# Patient Record
Sex: Male | Born: 1945 | Race: Black or African American | Hispanic: No | Smoking: Former smoker
Health system: Southern US, Community
[De-identification: ages and names within clinical notes are randomized; demographics above are authoritative.]

## PROBLEM LIST (undated history)

## (undated) DIAGNOSIS — R531 Weakness: Secondary | ICD-10-CM

## (undated) DIAGNOSIS — Z973 Presence of spectacles and contact lenses: Secondary | ICD-10-CM

## (undated) DIAGNOSIS — R51 Headache: Secondary | ICD-10-CM

## (undated) DIAGNOSIS — M329 Systemic lupus erythematosus, unspecified: Secondary | ICD-10-CM

## (undated) DIAGNOSIS — M3500 Sicca syndrome, unspecified: Secondary | ICD-10-CM

## (undated) DIAGNOSIS — Z993 Dependence on wheelchair: Secondary | ICD-10-CM

## (undated) DIAGNOSIS — K219 Gastro-esophageal reflux disease without esophagitis: Secondary | ICD-10-CM

## (undated) DIAGNOSIS — N320 Bladder-neck obstruction: Secondary | ICD-10-CM

## (undated) DIAGNOSIS — K08109 Complete loss of teeth, unspecified cause, unspecified class: Secondary | ICD-10-CM

## (undated) DIAGNOSIS — I10 Essential (primary) hypertension: Secondary | ICD-10-CM

## (undated) DIAGNOSIS — Z8719 Personal history of other diseases of the digestive system: Secondary | ICD-10-CM

## (undated) DIAGNOSIS — C801 Malignant (primary) neoplasm, unspecified: Secondary | ICD-10-CM

## (undated) DIAGNOSIS — N400 Enlarged prostate without lower urinary tract symptoms: Secondary | ICD-10-CM

## (undated) DIAGNOSIS — I252 Old myocardial infarction: Secondary | ICD-10-CM

## (undated) DIAGNOSIS — M1990 Unspecified osteoarthritis, unspecified site: Secondary | ICD-10-CM

## (undated) DIAGNOSIS — Z992 Dependence on renal dialysis: Secondary | ICD-10-CM

## (undated) DIAGNOSIS — Z972 Presence of dental prosthetic device (complete) (partial): Secondary | ICD-10-CM

## (undated) DIAGNOSIS — N189 Chronic kidney disease, unspecified: Secondary | ICD-10-CM

## (undated) HISTORY — DX: Old myocardial infarction: I25.2

## (undated) HISTORY — DX: Sjogren syndrome, unspecified: M32.9

## (undated) HISTORY — DX: Sicca syndrome, unspecified: M35.00

## (undated) HISTORY — PX: RENAL BIOPSY: SHX156

## (undated) HISTORY — DX: Essential (primary) hypertension: I10

## (undated) HISTORY — PX: AV FISTULA REPAIR: SHX563

## (undated) HISTORY — DX: Benign prostatic hyperplasia without lower urinary tract symptoms: N40.0

## (undated) HISTORY — PX: AV FISTULA PLACEMENT: SHX1204

## (undated) HISTORY — PX: INSERTION OF DIALYSIS CATHETER: SHX1324

## (undated) HISTORY — PX: REMOVAL OF A DIALYSIS CATHETER: SHX6053

## (undated) HISTORY — DX: Bladder-neck obstruction: N32.0

## (undated) HISTORY — PX: COLONOSCOPY: SHX174

## (undated) HISTORY — DX: Unspecified osteoarthritis, unspecified site: M19.90

## (undated) HISTORY — DX: Personal history of other diseases of the digestive system: Z87.19

## (undated) HISTORY — DX: Gastro-esophageal reflux disease without esophagitis: K21.9

---

## 2003-11-27 DIAGNOSIS — I252 Old myocardial infarction: Secondary | ICD-10-CM

## 2003-11-27 HISTORY — DX: Old myocardial infarction: I25.2

## 2004-09-06 ENCOUNTER — Emergency Department: Payer: Self-pay | Admitting: Emergency Medicine

## 2004-09-11 ENCOUNTER — Emergency Department: Payer: Self-pay | Admitting: Emergency Medicine

## 2004-09-11 ENCOUNTER — Other Ambulatory Visit: Payer: Self-pay

## 2005-01-24 ENCOUNTER — Ambulatory Visit: Payer: Self-pay | Admitting: Internal Medicine

## 2005-01-25 ENCOUNTER — Ambulatory Visit: Payer: Self-pay | Admitting: Internal Medicine

## 2006-11-26 DIAGNOSIS — C801 Malignant (primary) neoplasm, unspecified: Secondary | ICD-10-CM

## 2006-11-26 HISTORY — DX: Malignant (primary) neoplasm, unspecified: C80.1

## 2007-03-03 ENCOUNTER — Ambulatory Visit: Payer: Self-pay | Admitting: Gastroenterology

## 2007-08-27 HISTORY — PX: CARDIAC CATHETERIZATION: SHX172

## 2008-01-25 ENCOUNTER — Ambulatory Visit: Payer: PRIVATE HEALTH INSURANCE | Admitting: Radiation Oncology

## 2008-01-26 ENCOUNTER — Other Ambulatory Visit: Payer: Self-pay

## 2008-01-26 ENCOUNTER — Emergency Department: Payer: PRIVATE HEALTH INSURANCE | Admitting: Emergency Medicine

## 2008-02-20 ENCOUNTER — Ambulatory Visit: Payer: PRIVATE HEALTH INSURANCE | Admitting: Radiation Oncology

## 2008-02-25 ENCOUNTER — Ambulatory Visit: Payer: PRIVATE HEALTH INSURANCE | Admitting: Radiation Oncology

## 2008-03-26 ENCOUNTER — Ambulatory Visit: Payer: PRIVATE HEALTH INSURANCE | Admitting: Radiation Oncology

## 2008-04-26 ENCOUNTER — Ambulatory Visit: Payer: PRIVATE HEALTH INSURANCE | Admitting: Radiation Oncology

## 2008-05-26 ENCOUNTER — Ambulatory Visit: Payer: PRIVATE HEALTH INSURANCE | Admitting: Radiation Oncology

## 2008-06-26 ENCOUNTER — Ambulatory Visit: Payer: PRIVATE HEALTH INSURANCE | Admitting: Radiation Oncology

## 2008-08-17 ENCOUNTER — Ambulatory Visit: Payer: PRIVATE HEALTH INSURANCE | Admitting: Nephrology

## 2008-09-26 ENCOUNTER — Ambulatory Visit: Payer: PRIVATE HEALTH INSURANCE | Admitting: Radiation Oncology

## 2008-10-04 ENCOUNTER — Ambulatory Visit: Payer: PRIVATE HEALTH INSURANCE | Admitting: Radiation Oncology

## 2008-10-26 ENCOUNTER — Ambulatory Visit: Payer: PRIVATE HEALTH INSURANCE | Admitting: Radiation Oncology

## 2009-03-26 ENCOUNTER — Ambulatory Visit: Payer: Self-pay | Admitting: Radiation Oncology

## 2009-04-04 ENCOUNTER — Ambulatory Visit: Payer: Self-pay | Admitting: Radiation Oncology

## 2009-04-26 ENCOUNTER — Ambulatory Visit: Payer: Self-pay | Admitting: Radiation Oncology

## 2009-11-26 HISTORY — PX: DIALYSIS FISTULA CREATION: SHX611

## 2011-07-17 ENCOUNTER — Emergency Department: Payer: Self-pay | Admitting: Emergency Medicine

## 2011-08-02 ENCOUNTER — Emergency Department: Payer: Self-pay | Admitting: Emergency Medicine

## 2011-08-03 LAB — PSA: PSA: 0.6 ng/mL (ref 0.0–4.0)

## 2011-08-04 ENCOUNTER — Emergency Department: Payer: Self-pay | Admitting: *Deleted

## 2011-09-25 ENCOUNTER — Ambulatory Visit: Payer: Self-pay | Admitting: Internal Medicine

## 2011-09-29 ENCOUNTER — Emergency Department: Payer: Self-pay | Admitting: Emergency Medicine

## 2013-05-05 ENCOUNTER — Encounter: Payer: Self-pay | Admitting: *Deleted

## 2013-05-06 ENCOUNTER — Encounter: Payer: Self-pay | Admitting: Cardiology

## 2013-05-06 ENCOUNTER — Ambulatory Visit (INDEPENDENT_AMBULATORY_CARE_PROVIDER_SITE_OTHER): Payer: Medicare Other | Admitting: Cardiology

## 2013-05-06 VITALS — BP 156/70 | HR 74 | Ht 68.0 in | Wt 142.0 lb

## 2013-05-06 DIAGNOSIS — I10 Essential (primary) hypertension: Secondary | ICD-10-CM | POA: Insufficient documentation

## 2013-05-06 DIAGNOSIS — E7800 Pure hypercholesterolemia, unspecified: Secondary | ICD-10-CM

## 2013-05-06 DIAGNOSIS — I2510 Atherosclerotic heart disease of native coronary artery without angina pectoris: Secondary | ICD-10-CM

## 2013-05-06 DIAGNOSIS — R0602 Shortness of breath: Secondary | ICD-10-CM

## 2013-05-06 DIAGNOSIS — E785 Hyperlipidemia, unspecified: Secondary | ICD-10-CM | POA: Insufficient documentation

## 2013-05-06 MED ORDER — ATORVASTATIN CALCIUM 20 MG PO TABS: 20.0000 mg | ORAL_TABLET | Freq: Every day | ORAL | Status: DC

## 2013-05-06 NOTE — Progress Notes (Signed)
Patient ID: Christopher [name], male   DOB: 07-10-1946, 67 y.o.   MRN: [id number] PCP: Dr. Austin Miles [facility])  67 yo with history of ESRD and prior NSTEMIs with multiple caths at [facility] presents for cardiology evaluation prior to knee surgery.  On reviewing his history, it appears that he had 4 cardiac caths between 12/05 and 10/08.  The NSTEMIs all seemed to be type II NSTEMIs in the setting of volume overload as cardiac caths showed only nonobstructive disease.  He is not taking aspirin because of hematuria thought to be related to the radiation he had for prostate cancer.    Patient has bilateral severe knee pain from OA.  He needs operations on both knees.  The left knee will be operated on first.  He is not very active at all due to knee pain.  He walks in his house with a walker.  He could not get up a flight of steps.  No chest pain.  He has mild exertional dyspnea which he attributes to having to work harder to walk due to his knee pain.  Blood pressure is high today.  He says that his BP varies considerably and tends to look ok when he is at dialysis.  ECG: NSR, LVH with repolarization abnormality  PMH: 1. CAD: NSTEMI 12/05 with LHC at [facility] showing 30% mLAD.  NSTEMI in 1/07 with LHC at [facility] in 1/07 with nonobstructive disease.  Echo (1/07) with EF 55%, LVH, aortic sclerosis.  NSTEMI in 5/07 with LHC at [facility] in 5/07 showing nonobstructive disease.  Myoview 8/08 at [facility] showing inferior attenuation.  LHC at [facility] in 10/08 showing nonobstructive disease.  Echo (3/12) with EF > 55%.  NSTEMIs thought to be type II in setting of volume overload.  2. 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  On transplant list.  3. SLE 4. Sjogrens syndrome 5. Prostate cancer s/p radiation treatment in 2009.  6. BPH 7. HTN 8. GERD 9. OA 10. Bladder outlet obstruction with multiple stones 11. H/o pancreatitis. 12. Hematuria thought to be due to scarring after radiation for prostate cancer.     SH: Quit smoking in 1980s.  Lives in [place].   FH: Mother with MI at 56, brother with MI at 22.   ROS: All systems reviewed and negative except as per HPI.   Current Outpatient Prescriptions  Medication Sig Dispense Refill  . amLODipine (NORVASC) 10 MG tablet Take 10 mg by mouth daily.       . enalapril (VASOTEC) 20 MG tablet Take 20 mg by mouth 2 (two) times daily.       Marland Kitchen esomeprazole (NEXIUM) 20 MG capsule Take 20 mg by mouth daily before breakfast.      . metoprolol (LOPRESSOR) 100 MG tablet Take 100 mg by mouth 2 (two) times daily.      Marland Kitchen atorvastatin (LIPITOR) 20 MG tablet Take 1 tablet (20 mg total) by mouth daily.  30 tablet  3   No current facility-administered medications for this visit.    BP 156/70  Pulse 74  Ht 5\' 8"  (1.727 m)  Wt 142 lb (64.411 kg)  BMI 21.6 kg/m2 General: NAD Neck: No JVD, no thyromegaly or thyroid nodule.  Lungs: Clear to auscultation bilaterally with normal respiratory effort. CV: Nondisplaced PMI.  Heart regular S1/S2, +S4, no murmur.  No peripheral edema.  No carotid bruit.  Normal pedal pulses.  Abdomen: Soft, nontender, no hepatosplenomegaly, no distention.  Skin: Intact without lesions or rashes.  Neurologic: Alert and oriented x 3.  Psych: Normal affect. Extremities: No clubbing or cyanosis.  HEENT: Normal.   Assessment/Plan: 1. CAD: Nonobstructive CAD on prior cardiac caths (x 4 total, last in 10/08, all at [facility]).  He had an NSTEMI prior to each cath.  I suspect these were type II events in the setting volume overload.   - He has been told not to take ASA because of bleeding risk (hematuria).  - He should restart his statin.  - He is minimally active and needs to undergo knee surgery with general anesthesia.  He is unable to climb up a flight of steps.  I do think a Lexiscan Sestamibi would be reasonable for pre-op risk stratification.  2. Hyperlipidemia: Given nonobstructive CAD, I think it would be a good idea to start statin.   He was on atorvastatin in the past (several years) and stopped it due to expense.  Since then, it has gone generic.  I will restart him on atorvastatin 20 mg daily with lipids/LFTs in 2 months.   3. Hypertension: BP high today.  Has tended to run in normal range, however, per patient.  I will let his dialysis MD adjust his BP meds.   If stress test is low risk, he can undergo surgery.  He will need followup in 6 months in that case at our [place] office.   Marca Ancona 05/06/2013

## 2013-05-06 NOTE — Patient Instructions (Addendum)
Your physician has requested that you have a lexiscan myoview. For further information please visit [email or web address] Please follow instruction sheet, as given.  Start atorvastatin 20mg  daily.   Your physician recommends that you return for a FASTING lipid profile /liver profile in 2 months. This can be done in the [facility].   Your physician wants you to follow-up in: 6 months with Dr Mariah Milling or Kirke Corin in the [facility]. (December 2014).You will receive a reminder letter in the mail two months in advance. If you don't receive a letter, please call our office to schedule the follow-up appointment.

## 2013-05-07 NOTE — Addendum Note (Signed)
Addended by: Jacqlyn Krauss on: 05/07/2013 07:46 AM   Modules accepted: Orders

## 2013-05-13 ENCOUNTER — Encounter (HOSPITAL_COMMUNITY): Admission: RE | Admit: 2013-05-13 | Payer: Medicare Other | Source: Ambulatory Visit

## 2013-05-13 ENCOUNTER — Encounter (HOSPITAL_COMMUNITY)
Admission: RE | Admit: 2013-05-13 | Discharge: 2013-05-13 | Disposition: A | Discharge status: Home / Self Care | Payer: Medicare Other | Source: Ambulatory Visit | Attending: Cardiology | Admitting: Cardiology

## 2013-05-13 DIAGNOSIS — E7800 Pure hypercholesterolemia, unspecified: Secondary | ICD-10-CM

## 2013-05-13 DIAGNOSIS — R0602 Shortness of breath: Secondary | ICD-10-CM

## 2013-05-18 ENCOUNTER — Encounter (HOSPITAL_COMMUNITY): Payer: PRIVATE HEALTH INSURANCE

## 2013-05-25 ENCOUNTER — Telehealth: Payer: Self-pay | Admitting: Cardiology

## 2013-05-25 NOTE — Telephone Encounter (Signed)
Al Pimple spoke with Carollee Herter at [facility] (not [facility]) nuclear  medicine. Carollee Herter states imaging was obtained but not good quality.  Carollee Herter  states someone (she does not know a name)  from [employer] was called and the test was cancelled due to poor imaging. This is all the information Jasmine December was able to get. I will forward to Dr Shirlee Latch for review.

## 2013-05-25 NOTE — Telephone Encounter (Signed)
Spoke with patient.

## 2013-05-25 NOTE — Telephone Encounter (Signed)
Should have been Lexiscan, so not sure why he could not complete the test.  Also, why in [place]? See if you can find out what happened in [place].  He can be rescheduled for [facility] in our office, which is where it should have been done anyway.

## 2013-05-25 NOTE — Telephone Encounter (Signed)
Is there a reason we cannot do a Lexiscan Sesamibi in the office? If not, just do it in the office, images are usually better.  Otherwise, can have a dobutamine stress echo instead of a nuclear study.

## 2013-05-25 NOTE — Telephone Encounter (Signed)
New Prob    Pt would like to speak to nurse regarding his last myocardial perfusion. Please call.

## 2013-05-25 NOTE — Telephone Encounter (Signed)
Pt was scheduled at [facility] because of his physical limitations. I spoke with patient and he states he did not require any special equipment to be positioned for testing at [facility]. I am going to forward to  Nuc Med to contact patient to see if they feel myoview could be done here.

## 2013-05-25 NOTE — Telephone Encounter (Signed)
Pt states he went [place] to have nuclear stress test done. Pt states he was unable to complete the test. He was told they were unable to get images and test was never completed. He states he was told Dr Shirlee Latch was notified and there were other testing options. I will forward to Dr Shirlee Latch for review.

## 2013-06-01 ENCOUNTER — Ambulatory Visit (HOSPITAL_COMMUNITY): Payer: Medicare Other | Attending: Cardiology | Admitting: Radiology

## 2013-06-01 VITALS — BP 156/87 | Ht 68.0 in | Wt 141.0 lb

## 2013-06-01 DIAGNOSIS — R9431 Abnormal electrocardiogram [ECG] [EKG]: Secondary | ICD-10-CM

## 2013-06-01 DIAGNOSIS — R0602 Shortness of breath: Secondary | ICD-10-CM

## 2013-06-01 DIAGNOSIS — Z87891 Personal history of nicotine dependence: Secondary | ICD-10-CM | POA: Insufficient documentation

## 2013-06-01 DIAGNOSIS — R0609 Other forms of dyspnea: Secondary | ICD-10-CM | POA: Insufficient documentation

## 2013-06-01 DIAGNOSIS — I10 Essential (primary) hypertension: Secondary | ICD-10-CM | POA: Insufficient documentation

## 2013-06-01 DIAGNOSIS — Z8249 Family history of ischemic heart disease and other diseases of the circulatory system: Secondary | ICD-10-CM | POA: Insufficient documentation

## 2013-06-01 DIAGNOSIS — I252 Old myocardial infarction: Secondary | ICD-10-CM | POA: Insufficient documentation

## 2013-06-01 DIAGNOSIS — Z01810 Encounter for preprocedural cardiovascular examination: Secondary | ICD-10-CM | POA: Insufficient documentation

## 2013-06-01 DIAGNOSIS — R0989 Other specified symptoms and signs involving the circulatory and respiratory systems: Secondary | ICD-10-CM | POA: Insufficient documentation

## 2013-06-01 MED ORDER — REGADENOSON 0.4 MG/5ML IV SOLN
0.4000 mg | Freq: Once | INTRAVENOUS | Status: AC
  Administered 2013-06-01: 0.4 mg via INTRAVENOUS

## 2013-06-01 MED ORDER — TECHNETIUM TC 99M SESTAMIBI GENERIC - CARDIOLITE
10.0000 | Freq: Once | INTRAVENOUS | Status: AC | PRN
  Administered 2013-06-01: 10 via INTRAVENOUS

## 2013-06-01 MED ORDER — TECHNETIUM TC 99M SESTAMIBI GENERIC - CARDIOLITE
30.0000 | Freq: Once | INTRAVENOUS | Status: AC | PRN
  Administered 2013-06-01: 30 via INTRAVENOUS

## 2013-06-01 NOTE — Progress Notes (Addendum)
[facility] SITE 3 NUCLEAR MED [address] [place], [place] [address] [phone]    Cardiology Nuclear Med Study  Christopher [name] is a 67 y.o. male     MRN : [id number]     DOB: 09-Oct-1946  Procedure Date: 06/01/2013  Nuclear Med Background Indication for Stress Test:  Evaluation for Ischemia, and Surgical Clearance for  (L) Knee Surgery by Christopher Romans, MD History:  '08 MI-Several 2nd to volume overload-Heart Cath: 4 Cath @ [facility] N/O CAD EF: 78% MPS: @ [facility] (-) ischemia inferior attenuation 3/12 ECHO: EF: 55% Cardiac Risk Factors: Family History - CAD, History of Smoking and Hypertension  Symptoms:  DOE   Nuclear Pre-Procedure Caffeine/Decaff Intake:  None> 12 hrs NPO After: 9:30pm   Lungs:  clear O2 Sat: 100% on room air. IV 0.9% NS with Angio Cath:  20g  IV Site: L Antecubital x 1, tolerated well IV Started by:  Christopher Hong, RN  Chest Size (in):  40 Cup Size: n/a  Height: 5\' 8"  (1.727 m)  Weight:  141 lb (63.957 kg)  BMI:  Body mass index is 21.44 kg/(m^2). Tech Comments:  Nexium this am; Took Lopressor last night.    Nuclear Med Study 1 or 2 day study: 1 day  Stress Test Type:  Christopher [name]  Reading MD: Christopher Clement, MD  Order Authorizing Provider:  Marca Ancona, MD  Resting Radionuclide: Technetium 100m Sestamibi  Resting Radionuclide Dose: 11.0 mCi   Stress Radionuclide:  Technetium 42m Sestamibi  Stress Radionuclide Dose: 33.0 mCi           Stress Protocol Rest HR: 59 Stress HR: 88  Rest BP: 156/87 Stress BP: 154/86  Exercise Time (min): n/a METS: n/a   Predicted Max HR: 151 bpm % Max HR: 58.28 bpm Rate Pressure Product: 21308   Dose of Adenosine (mg):  n/a Dose of Lexiscan: 0.4 mg  Dose of Atropine (mg): n/a Dose of Dobutamine: n/a mcg/kg/min (at max HR)  Stress Test Technologist: Christopher [name], EMT-P  Nuclear Technologist:  Christopher [name], CNMT     Rest Procedure:  Myocardial perfusion imaging was performed at rest 45 minutes following the  intravenous administration of Technetium 72m Sestamibi. Rest ECG: NSR with non-specific ST-T wave changes  Stress Procedure:  The patient received IV Lexiscan 0.4 mg over 15-seconds.  Technetium 74m Sestamibi injected at 30-seconds. This patient had abdominal pain and was fatigued with the Lexiscan injection. Quantitative spect images were obtained after a 45 minute delay. Stress ECG: No significant change from baseline ECG  QPS Raw Data Images:  There is interference from nuclear activity from structures below the diaphragm. This does not affect the ability to read the study. Stress Images:  Normal homogeneous uptake in all areas of the myocardium. Rest Images:  Normal homogeneous uptake in all areas of the myocardium. Subtraction (SDS):  No evidence of ischemia. Transient Ischemic Dilatation (Normal <1.22):  [name] Lung/Heart Ratio (Normal <0.45):  0.28  Quantitative Gated Spect Images QGS EDV:  139 ml QGS ESV:  86 ml  Impression Exercise Capacity:  Lexiscan with no exercise. BP Response:  Normal blood pressure response. Clinical Symptoms:  No chest pain. ECG Impression:  No significant ECG changes with Lexiscan. Comparison with Prior Nuclear Study: No images to compare  Overall Impression:  Low risk stress nuclear study.  No reversible ischemia. SDS = 0.  Moderate LV systolic dysfunction with EF 38 % and global hypokinesis.  Recommend 2D echo to confirm LF EF  LV Ejection Fraction: 38%.  LV Wall Motion:  Global hypokinesis.   Christopher [name]  No ischemia or infarction but EF is low.  This needs to be confirmed by echo and need to make sure that he has followup.   Christopher [name] 06/02/2013

## 2013-06-02 ENCOUNTER — Telehealth: Payer: Self-pay | Admitting: *Deleted

## 2013-06-02 DIAGNOSIS — R0602 Shortness of breath: Secondary | ICD-10-CM

## 2013-06-02 NOTE — Progress Notes (Signed)
Called patient with results and need for echo. Will set up follow up based on echo results.

## 2013-06-02 NOTE — Telephone Encounter (Signed)
Called patient and advised results of nuc stress myoview and need for echo per Dr.Brackbill. He would like to have test done in the [place] office. Advised will send order to the [facility] and he will receive a call back to schedule the appointment.

## 2013-06-08 NOTE — Progress Notes (Signed)
Echo scheduled for 06/19/13.

## 2013-06-19 ENCOUNTER — Other Ambulatory Visit (INDEPENDENT_AMBULATORY_CARE_PROVIDER_SITE_OTHER): Payer: Medicare Other

## 2013-06-19 ENCOUNTER — Other Ambulatory Visit: Payer: Self-pay

## 2013-06-19 DIAGNOSIS — R0602 Shortness of breath: Secondary | ICD-10-CM

## 2013-06-19 DIAGNOSIS — I2510 Atherosclerotic heart disease of native coronary artery without angina pectoris: Secondary | ICD-10-CM

## 2013-06-19 DIAGNOSIS — R0989 Other specified symptoms and signs involving the circulatory and respiratory systems: Secondary | ICD-10-CM

## 2013-07-06 ENCOUNTER — Other Ambulatory Visit: Payer: PRIVATE HEALTH INSURANCE

## 2013-07-07 ENCOUNTER — Telehealth: Payer: Self-pay | Admitting: Cardiology

## 2013-07-07 NOTE — Telephone Encounter (Signed)
New problem  karen/dr owen need to know if pt is cleared for sx and need it in writing. Please fax to [phone].

## 2013-07-07 NOTE — Telephone Encounter (Signed)
LM for Christopher [name] that Dr Shirlee Latch is out of office until 07/13/13. Surgical clearance form for Dr Shirlee Latch to complete is on his cart.

## 2013-07-13 NOTE — Telephone Encounter (Signed)
Dr Shirlee Latch completed request for surgical clearance form. Pt cleared for surgery with the recommendation to continue beta blocker perioperatively. Completed from returned to HIM to be faxed to Dr Charlann Boxer.

## 2013-07-14 ENCOUNTER — Telehealth: Payer: Self-pay | Admitting: Cardiology

## 2013-07-14 NOTE — Telephone Encounter (Signed)
Received request from Nurse, documents faxed for surgical clearance. To: [facility] Fax number: [phone]  Attention:Karen Janyth Pupa  07/14/13/KM

## 2013-07-30 ENCOUNTER — Encounter (HOSPITAL_COMMUNITY): Payer: Self-pay | Admitting: Pharmacy Technician

## 2013-08-04 ENCOUNTER — Other Ambulatory Visit (HOSPITAL_COMMUNITY): Payer: Self-pay | Admitting: Orthopedic Surgery

## 2013-08-04 NOTE — Progress Notes (Signed)
STRESS TEST AND EKG 05-06-2013 EPIC 06-21-2013  ECHO EPIC NEPHROLOGY CLEARANCE NOTE DR Austin Miles ON CHART

## 2013-08-04 NOTE — Progress Notes (Addendum)
Cardiac clearance note dr Shirlee Latch on chart, LOV note from dialysis center 07/28/13 on chart

## 2013-08-04 NOTE — Patient Instructions (Addendum)
20 ELIC VENCILL  08/04/2013   Your procedure is scheduled on: 08-11-2013  Report to [facility] at 600 AM.  Call this number if you have problems the morning of surgery [phone]   Remember:   Do not eat food or drink liquids :After Midnight.     Take these medicines the morning of surgery with A SIP OF WATER: amlodipine, metoprolol, nexium                              Do not wear jewelry, make-up.  Do not wear lotions, powders, or perfumes. You may wear deodorant.   Men may shave face and neck.  Do not bring valuables to the hospital. [facility] IS NOT RESPONSIBLE FOR VALUEABLES.  Contacts, dentures or bridgework may not be worn into surgery.  Leave suitcase in the car. After surgery it may be brought to your room.  For patients admitted to the hospital, checkout time is 11:00 AM the day of discharge.    Please read over the following fact sheets that you were given: MRSA INFORMATION, BLOOD FACT SHEET, INCENTIVE SPIROMETER FACT SHEET  Call Berthel Bagnall RN pre op nurse if needed [phone]    FAILURE TO FOLLOW THESE INSTRUCTIONS MAY RESULT IN THE CANCELLATION OF YOUR SURGERY.  PATIENT SIGNATURE___________________________________________  NURSE SIGNATURE_____________________________________________

## 2013-08-05 ENCOUNTER — Encounter (HOSPITAL_COMMUNITY): Payer: Self-pay

## 2013-08-05 ENCOUNTER — Encounter (HOSPITAL_COMMUNITY)
Admission: RE | Admit: 2013-08-05 | Discharge: 2013-08-05 | Disposition: A | Discharge status: Home / Self Care | Payer: Medicare Other | Source: Ambulatory Visit | Attending: Orthopedic Surgery | Admitting: Orthopedic Surgery

## 2013-08-05 ENCOUNTER — Ambulatory Visit (HOSPITAL_COMMUNITY)
Admission: RE | Admit: 2013-08-05 | Discharge: 2013-08-05 | Disposition: A | Discharge status: Home / Self Care | Payer: Medicare Other | Source: Ambulatory Visit | Attending: Orthopedic Surgery | Admitting: Orthopedic Surgery

## 2013-08-05 DIAGNOSIS — Z01818 Encounter for other preprocedural examination: Secondary | ICD-10-CM | POA: Insufficient documentation

## 2013-08-05 DIAGNOSIS — I700 Atherosclerosis of aorta: Secondary | ICD-10-CM | POA: Insufficient documentation

## 2013-08-05 DIAGNOSIS — Z01812 Encounter for preprocedural laboratory examination: Secondary | ICD-10-CM | POA: Insufficient documentation

## 2013-08-05 DIAGNOSIS — M1710 Unilateral primary osteoarthritis, unspecified knee: Secondary | ICD-10-CM | POA: Insufficient documentation

## 2013-08-05 HISTORY — DX: Dependence on renal dialysis: Z99.2

## 2013-08-05 HISTORY — DX: Chronic kidney disease, unspecified: N18.9

## 2013-08-05 HISTORY — DX: Malignant (primary) neoplasm, unspecified: C80.1

## 2013-08-05 HISTORY — DX: Headache: R51

## 2013-08-05 LAB — SURGICAL PCR SCREEN: MRSA, PCR: NEGATIVE

## 2013-08-05 NOTE — H&P (Signed)
TOTAL KNEE ADMISSION H&P  Patient is being admitted for right total knee arthroplasty.  Subjective:  Chief Complaint:    Right knee OA / pain.  HPI: Christopher [name], 67 y.o. male, has a history of pain and functional disability in the right knee due to arthritis and has failed non-surgical conservative treatments for greater than 12 weeks to includeNSAID's and/or analgesics, corticosteriod injections, use of assistive devices and activity modification.  Onset of symptoms was gradual, starting 3+ years ago with gradually worsening course since that time. The patient noted no past surgery on the right knee(s).  Patient currently rates pain in the right knee(s) at 10 out of 10 with activity. Patient has night pain, worsening of pain with activity and weight bearing, pain that interferes with activities of daily living and pain with passive range of motion.  Patient has evidence of periarticular osteophytes and joint space narrowing by imaging studies. There is no active infection.  Risks, benefits and expectations were discussed with the patient. Patient understand the risks, benefits and expectations and wishes to proceed with surgery.   D/C Plans:   SNF  Post-op Meds:    No Rx given  Tranexamic Acid:   Not to be given - previous MI  Decadron:    To be given  FYI:    Dialysis patient   Patient Active Problem List   Diagnosis Date Noted  . CAD (coronary artery disease) 05/06/2013  . Hyperlipidemia 05/06/2013  . Essential hypertension 05/06/2013   Past Medical History  Diagnosis Date  . SLE-Sjogren overlap syndrome   . BPH (benign prostatic hypertrophy)   . Esophageal reflux   . Unspecified essential hypertension   . Osteoarthritis   . History of pancreatitis   . Bladder outlet obstruction     calculi  . History of non-ST elevation myocardial infarction (NSTEMI) 2005  . Dialysis patient   . Headache(784.0)   . Cancer 2008    prostate cancer  . Chronic kidney disease     kidney  failure    Past Surgical History  Procedure Laterality Date  . Dialysis fistula creation Right     arm  . Cardiac catheterization  08/27/2007    couple of those  . Renal biopsy Left     No prescriptions prior to admission   No Known Allergies   History  Substance Use Topics  . Smoking status: Former Smoker -- 1.00 packs/day for 20 years    Types: Cigarettes    Quit date: 11/27/1983  . Smokeless tobacco: Never Used     Comment: quit 1985  . Alcohol Use: No     Comment: stopped 1986    Family History  Problem Relation Age of Onset  . Heart attack Mother 68  . Heart attack Brother 47  . Hypertension       Review of Systems  Constitutional: Negative.   Eyes: Negative.   Respiratory: Negative.   Cardiovascular: Negative.   Gastrointestinal: Positive for heartburn.  Genitourinary: Negative.   Musculoskeletal: Positive for joint pain.  Skin: Negative.   Neurological: Positive for headaches.  Endo/Heme/Allergies: Negative.   Psychiatric/Behavioral: Negative.     Objective:  Physical Exam  Constitutional: He is oriented to person, place, and time. He appears well-developed.  HENT:  Head: Normocephalic and atraumatic.  Mouth/Throat: Oropharynx is clear and moist. He has dentures.  Eyes: Pupils are equal, round, and reactive to light.  Neck: Neck supple. No JVD present. No tracheal deviation present. No thyromegaly present.  Cardiovascular: Normal rate, regular rhythm and intact distal pulses.   Respiratory: Effort normal and breath sounds normal.  GI: Soft. There is no tenderness. There is no guarding.  Musculoskeletal:       Right knee: He exhibits decreased range of motion, swelling, effusion and bony tenderness. He exhibits no ecchymosis, no deformity and no laceration. Tenderness found.  Lymphadenopathy:    He has no cervical adenopathy.  Neurological: He is alert and oriented to person, place, and time.  Skin: Skin is warm and dry.  Psychiatric: He has a  normal mood and affect.    Vital signs in last 24 hours: Temp:  [98.2 F (36.8 C)] 98.2 F (36.8 C) (09/10 0921) Pulse Rate:  [64] 64 (09/10 0921) Resp:  [18] 18 (09/10 0921) BP: (186)/(84) 186/84 mmHg (09/10 0921) SpO2:  [94 %] 94 % (09/10 0921) Weight:  [65.318 kg (144 lb)] 65.318 kg (144 lb) (09/10 0921)  Labs:  Estimated body mass index is 21.90 kg/(m^2) as calculated from the following:   Height as of 06/01/13: 5\' 8"  (1.727 m).   Weight as of 08/05/13: 65.318 kg (144 lb).   Imaging Review Plain radiographs demonstrate severe degenerative joint disease of the right knee(s). The overall alignment isneutral. The bone quality appears to be fair for age and reported activity level.  Assessment/Plan:  End stage arthritis, right knee   The patient history, physical examination, clinical judgment of the provider and imaging studies are consistent with end stage degenerative joint disease of the right knee(s) and total knee arthroplasty is deemed medically necessary. The treatment options including medical management, injection therapy arthroscopy and arthroplasty were discussed at length. The risks and benefits of total knee arthroplasty were presented and reviewed. The risks due to aseptic loosening, infection, stiffness, patella tracking problems, thromboembolic complications and other imponderables were discussed. The patient acknowledged the explanation, agreed to proceed with the plan and consent was signed. Patient is being admitted for inpatient treatment for surgery, pain control, PT, OT, prophylactic antibiotics, VTE prophylaxis, progressive ambulation and ADL's and discharge planning. The patient is planning to be discharged to skilled nursing facility.    Anastasio Auerbach Axzel [place]   PAC  08/05/2013, 8:50 PM

## 2013-08-05 NOTE — Progress Notes (Signed)
Pt is a dialysis pt. "Only makes a small amount of urine with bowel movements occasionally." Could not get urine sample

## 2013-08-05 NOTE — Progress Notes (Signed)
08/05/13 0843  OBSTRUCTIVE SLEEP APNEA  Have you ever been diagnosed with sleep apnea through a sleep study? No  Do you snore loudly (loud enough to be heard through closed doors)?  1  Do you often feel tired, fatigued, or sleepy during the daytime? 0  Has anyone observed you stop breathing during your sleep? 0  Do you have, or are you being treated for high blood pressure? 1  BMI more than 35 kg/m2? 0  Age over 69 years old? 1  Neck circumference greater than 40 cm/18 inches? 0  Gender: 1  Obstructive Sleep Apnea Score 4  Score 4 or greater  Results sent to PCP

## 2013-08-10 ENCOUNTER — Encounter (HOSPITAL_COMMUNITY): Payer: Self-pay | Admitting: *Deleted

## 2013-08-10 MED ORDER — CEFAZOLIN SODIUM-DEXTROSE 2-3 GM-% IV SOLR
2.0000 g | INTRAVENOUS | Status: AC
  Administered 2013-08-11: 2 g via INTRAVENOUS
  Filled 2013-08-10: qty 50

## 2013-08-11 ENCOUNTER — Encounter (HOSPITAL_COMMUNITY): Payer: Self-pay | Admitting: *Deleted

## 2013-08-11 ENCOUNTER — Encounter (HOSPITAL_COMMUNITY): Admission: RE | Payer: Self-pay | Source: Ambulatory Visit

## 2013-08-11 ENCOUNTER — Encounter (HOSPITAL_COMMUNITY)
Admission: RE | Disposition: A | Discharge status: Skilled Nursing Facility | Payer: Self-pay | Source: Ambulatory Visit | Attending: Orthopedic Surgery

## 2013-08-11 ENCOUNTER — Inpatient Hospital Stay (HOSPITAL_COMMUNITY)
Admission: RE | Admit: 2013-08-11 | Discharge: 2013-08-15 | DRG: 469 | Disposition: A | Discharge status: Skilled Nursing Facility | Payer: Medicare Other | Source: Ambulatory Visit | Attending: Orthopedic Surgery | Admitting: Orthopedic Surgery

## 2013-08-11 ENCOUNTER — Inpatient Hospital Stay (HOSPITAL_COMMUNITY): Admission: RE | Admit: 2013-08-11 | Payer: Medicare Other | Source: Ambulatory Visit | Admitting: Orthopedic Surgery

## 2013-08-11 ENCOUNTER — Inpatient Hospital Stay (HOSPITAL_COMMUNITY): Payer: Medicare Other | Admitting: Anesthesiology

## 2013-08-11 ENCOUNTER — Encounter (HOSPITAL_COMMUNITY): Payer: Self-pay | Admitting: Anesthesiology

## 2013-08-11 DIAGNOSIS — Z96651 Presence of right artificial knee joint: Secondary | ICD-10-CM

## 2013-08-11 DIAGNOSIS — I2510 Atherosclerotic heart disease of native coronary artery without angina pectoris: Secondary | ICD-10-CM | POA: Diagnosis present

## 2013-08-11 DIAGNOSIS — Z87891 Personal history of nicotine dependence: Secondary | ICD-10-CM

## 2013-08-11 DIAGNOSIS — M1710 Unilateral primary osteoarthritis, unspecified knee: Principal | ICD-10-CM | POA: Diagnosis present

## 2013-08-11 DIAGNOSIS — N2581 Secondary hyperparathyroidism of renal origin: Secondary | ICD-10-CM | POA: Diagnosis present

## 2013-08-11 DIAGNOSIS — D62 Acute posthemorrhagic anemia: Secondary | ICD-10-CM | POA: Diagnosis not present

## 2013-08-11 DIAGNOSIS — M6580 Other synovitis and tenosynovitis, unspecified site: Secondary | ICD-10-CM | POA: Diagnosis present

## 2013-08-11 DIAGNOSIS — Z992 Dependence on renal dialysis: Secondary | ICD-10-CM

## 2013-08-11 DIAGNOSIS — I252 Old myocardial infarction: Secondary | ICD-10-CM

## 2013-08-11 DIAGNOSIS — Z8546 Personal history of malignant neoplasm of prostate: Secondary | ICD-10-CM

## 2013-08-11 DIAGNOSIS — E785 Hyperlipidemia, unspecified: Secondary | ICD-10-CM | POA: Diagnosis present

## 2013-08-11 DIAGNOSIS — M329 Systemic lupus erythematosus, unspecified: Secondary | ICD-10-CM | POA: Diagnosis present

## 2013-08-11 DIAGNOSIS — N186 End stage renal disease: Secondary | ICD-10-CM | POA: Diagnosis present

## 2013-08-11 DIAGNOSIS — I120 Hypertensive chronic kidney disease with stage 5 chronic kidney disease or end stage renal disease: Secondary | ICD-10-CM | POA: Diagnosis present

## 2013-08-11 DIAGNOSIS — Z96659 Presence of unspecified artificial knee joint: Secondary | ICD-10-CM

## 2013-08-11 DIAGNOSIS — N400 Enlarged prostate without lower urinary tract symptoms: Secondary | ICD-10-CM | POA: Diagnosis present

## 2013-08-11 DIAGNOSIS — K219 Gastro-esophageal reflux disease without esophagitis: Secondary | ICD-10-CM | POA: Diagnosis present

## 2013-08-11 HISTORY — PX: TOTAL KNEE ARTHROPLASTY: SHX125

## 2013-08-11 LAB — PROTIME-INR: Prothrombin Time: 12.9 seconds (ref 11.6–15.2)

## 2013-08-11 LAB — APTT: aPTT: 24 seconds (ref 24–37)

## 2013-08-11 LAB — BASIC METABOLIC PANEL
CO2: 27 mEq/L (ref 19–32)
Calcium: 9.4 mg/dL (ref 8.4–10.5)
Glucose, Bld: 78 mg/dL (ref 70–99)
Potassium: 3.8 mEq/L (ref 3.5–5.1)
Sodium: 131 mEq/L — ABNORMAL LOW (ref 135–145)

## 2013-08-11 LAB — CBC
Hemoglobin: 12.1 g/dL — ABNORMAL LOW (ref 13.0–17.0)
MCH: 31.9 pg (ref 26.0–34.0)
MCV: 93.4 fL (ref 78.0–100.0)
Platelets: 134 10*3/uL — ABNORMAL LOW (ref 150–400)
RBC: 3.79 MIL/uL — ABNORMAL LOW (ref 4.22–5.81)
WBC: 3.8 10*3/uL — ABNORMAL LOW (ref 4.0–10.5)

## 2013-08-11 LAB — ABO/RH: ABO/RH(D): B POS

## 2013-08-11 SURGERY — ARTHROPLASTY, KNEE, TOTAL
Anesthesia: Monitor Anesthesia Care | Site: Knee | Laterality: Right | Wound class: Clean

## 2013-08-11 SURGERY — ARTHROPLASTY, KNEE, TOTAL
Anesthesia: Spinal | Site: Knee | Laterality: Right

## 2013-08-11 MED ORDER — BISACODYL 10 MG RE SUPP: 10.0000 mg | Freq: Every day | RECTAL | Status: DC | PRN

## 2013-08-11 MED ORDER — ONDANSETRON HCL 4 MG/2ML IJ SOLN
INTRAMUSCULAR | Status: DC | PRN
  Administered 2013-08-11: 4 mg via INTRAVENOUS

## 2013-08-11 MED ORDER — SODIUM CHLORIDE 0.9 % IR SOLN
Status: DC | PRN
  Administered 2013-08-11: 3000 mL

## 2013-08-11 MED ORDER — CEFAZOLIN SODIUM-DEXTROSE 2-3 GM-% IV SOLR
2.0000 g | Freq: Four times a day (QID) | INTRAVENOUS | Status: AC
  Administered 2013-08-11 – 2013-08-12 (×2): 2 g via INTRAVENOUS
  Filled 2013-08-11 (×2): qty 50

## 2013-08-11 MED ORDER — DIPHENHYDRAMINE HCL 25 MG PO CAPS: 25.0000 mg | ORAL_CAPSULE | Freq: Four times a day (QID) | ORAL | Status: DC | PRN

## 2013-08-11 MED ORDER — MIDAZOLAM HCL 2 MG/2ML IJ SOLN
INTRAMUSCULAR | Status: DC | PRN
  Administered 2013-08-11: 2 mg via INTRAVENOUS

## 2013-08-11 MED ORDER — ALUM & MAG HYDROXIDE-SIMETH 200-200-20 MG/5ML PO SUSP
30.0000 mL | ORAL | Status: DC | PRN
  Filled 2013-08-11: qty 30

## 2013-08-11 MED ORDER — METOPROLOL TARTRATE 100 MG PO TABS
100.0000 mg | ORAL_TABLET | Freq: Two times a day (BID) | ORAL | Status: DC
  Administered 2013-08-12 – 2013-08-14 (×6): 100 mg via ORAL
  Filled 2013-08-11 (×9): qty 1

## 2013-08-11 MED ORDER — BUPIVACAINE LIPOSOME 1.3 % IJ SUSP
20.0000 mL | INTRAMUSCULAR | Status: AC
  Filled 2013-08-11: qty 20

## 2013-08-11 MED ORDER — AMLODIPINE BESYLATE 10 MG PO TABS
10.0000 mg | ORAL_TABLET | Freq: Every morning | ORAL | Status: DC
  Administered 2013-08-12 – 2013-08-14 (×3): 10 mg via ORAL
  Filled 2013-08-11 (×4): qty 1

## 2013-08-11 MED ORDER — ONDANSETRON HCL 4 MG/2ML IJ SOLN: 4.0000 mg | Freq: Four times a day (QID) | INTRAMUSCULAR | Status: DC | PRN

## 2013-08-11 MED ORDER — SODIUM CHLORIDE 0.9 % IV SOLN
INTRAVENOUS | Status: DC
  Administered 2013-08-11 (×2): via INTRAVENOUS

## 2013-08-11 MED ORDER — KETOROLAC TROMETHAMINE 30 MG/ML IJ SOLN
30.0000 mg | INTRAMUSCULAR | Status: AC
  Filled 2013-08-11: qty 1

## 2013-08-11 MED ORDER — POLYETHYLENE GLYCOL 3350 17 G PO PACK
17.0000 g | PACK | Freq: Two times a day (BID) | ORAL | Status: DC
  Administered 2013-08-11 – 2013-08-14 (×6): 17 g via ORAL
  Filled 2013-08-11 (×10): qty 1

## 2013-08-11 MED ORDER — ASPIRIN EC 325 MG PO TBEC
325.0000 mg | DELAYED_RELEASE_TABLET | Freq: Two times a day (BID) | ORAL | Status: DC
  Administered 2013-08-12 – 2013-08-14 (×6): 325 mg via ORAL
  Filled 2013-08-11 (×9): qty 1

## 2013-08-11 MED ORDER — METOCLOPRAMIDE HCL 10 MG PO TABS: 5.0000 mg | ORAL_TABLET | Freq: Three times a day (TID) | ORAL | Status: DC | PRN

## 2013-08-11 MED ORDER — METHOCARBAMOL 500 MG PO TABS
ORAL_TABLET | ORAL | Status: AC
  Filled 2013-08-11: qty 1

## 2013-08-11 MED ORDER — ONDANSETRON HCL 4 MG PO TABS: 4.0000 mg | ORAL_TABLET | Freq: Four times a day (QID) | ORAL | Status: DC | PRN

## 2013-08-11 MED ORDER — SODIUM CHLORIDE 0.9 % IV SOLN
INTRAVENOUS | Status: DC
  Filled 2013-08-11 (×4): qty 1000

## 2013-08-11 MED ORDER — SUFENTANIL CITRATE 50 MCG/ML IV SOLN
INTRAVENOUS | Status: DC | PRN
  Administered 2013-08-11 (×2): 5 ug via INTRAVENOUS

## 2013-08-11 MED ORDER — HYDROMORPHONE HCL PF 1 MG/ML IJ SOLN
INTRAMUSCULAR | Status: AC
  Filled 2013-08-11: qty 1

## 2013-08-11 MED ORDER — SODIUM CHLORIDE 0.9 % IJ SOLN
INTRAMUSCULAR | Status: DC | PRN
  Administered 2013-08-11: 19 mL via INTRAVENOUS

## 2013-08-11 MED ORDER — MENTHOL 3 MG MT LOZG: 1.0000 | LOZENGE | OROMUCOSAL | Status: DC | PRN

## 2013-08-11 MED ORDER — ZOLPIDEM TARTRATE 5 MG PO TABS: 5.0000 mg | ORAL_TABLET | Freq: Every evening | ORAL | Status: DC | PRN

## 2013-08-11 MED ORDER — METHOCARBAMOL 500 MG PO TABS
500.0000 mg | ORAL_TABLET | Freq: Four times a day (QID) | ORAL | Status: DC | PRN
  Administered 2013-08-11 – 2013-08-12 (×3): 500 mg via ORAL
  Filled 2013-08-11 (×2): qty 1

## 2013-08-11 MED ORDER — DOCUSATE SODIUM 100 MG PO CAPS
100.0000 mg | ORAL_CAPSULE | Freq: Two times a day (BID) | ORAL | Status: DC
  Administered 2013-08-11 – 2013-08-14 (×7): 100 mg via ORAL
  Filled 2013-08-11 (×11): qty 1

## 2013-08-11 MED ORDER — PHENOL 1.4 % MT LIQD: 1.0000 | OROMUCOSAL | Status: DC | PRN

## 2013-08-11 MED ORDER — METHOCARBAMOL 100 MG/ML IJ SOLN
500.0000 mg | Freq: Four times a day (QID) | INTRAVENOUS | Status: DC | PRN
  Filled 2013-08-11: qty 5

## 2013-08-11 MED ORDER — PROPOFOL INFUSION 10 MG/ML OPTIME
INTRAVENOUS | Status: DC | PRN
  Administered 2013-08-11: 75 ug/kg/min via INTRAVENOUS

## 2013-08-11 MED ORDER — PANTOPRAZOLE SODIUM 40 MG PO TBEC
40.0000 mg | DELAYED_RELEASE_TABLET | Freq: Every day | ORAL | Status: DC
  Administered 2013-08-12: 40 mg via ORAL
  Filled 2013-08-11: qty 1

## 2013-08-11 MED ORDER — BUPIVACAINE-EPINEPHRINE PF 0.5-1:200000 % IJ SOLN
INTRAMUSCULAR | Status: DC | PRN
  Administered 2013-08-11: 20 mL

## 2013-08-11 MED ORDER — FERROUS SULFATE 325 (65 FE) MG PO TABS
325.0000 mg | ORAL_TABLET | Freq: Three times a day (TID) | ORAL | Status: DC
  Administered 2013-08-12 (×3): 325 mg via ORAL
  Filled 2013-08-11 (×7): qty 1

## 2013-08-11 MED ORDER — ENALAPRIL MALEATE 20 MG PO TABS
20.0000 mg | ORAL_TABLET | Freq: Two times a day (BID) | ORAL | Status: DC
  Administered 2013-08-12 – 2013-08-14 (×6): 20 mg via ORAL
  Filled 2013-08-11 (×9): qty 1

## 2013-08-11 MED ORDER — HYDROMORPHONE HCL PF 1 MG/ML IJ SOLN
0.2500 mg | INTRAMUSCULAR | Status: DC | PRN
  Administered 2013-08-11 (×2): 0.5 mg via INTRAVENOUS

## 2013-08-11 MED ORDER — METOCLOPRAMIDE HCL 5 MG/ML IJ SOLN: 5.0000 mg | Freq: Three times a day (TID) | INTRAMUSCULAR | Status: DC | PRN

## 2013-08-11 MED ORDER — DEXAMETHASONE SODIUM PHOSPHATE 10 MG/ML IJ SOLN
10.0000 mg | Freq: Once | INTRAMUSCULAR | Status: AC
  Administered 2013-08-11: 10 mg via INTRAVENOUS
  Filled 2013-08-11: qty 1

## 2013-08-11 MED ORDER — BUPIVACAINE LIPOSOME 1.3 % IJ SUSP
INTRAMUSCULAR | Status: DC | PRN
  Administered 2013-08-11: 20 mL

## 2013-08-11 MED ORDER — BUPIVACAINE-EPINEPHRINE (PF) 0.5% -1:200000 IJ SOLN
INTRAMUSCULAR | Status: AC
  Filled 2013-08-11: qty 10

## 2013-08-11 MED ORDER — FLEET ENEMA 7-19 GM/118ML RE ENEM: 1.0000 | ENEMA | Freq: Once | RECTAL | Status: AC | PRN

## 2013-08-11 MED ORDER — HYDROMORPHONE HCL PF 1 MG/ML IJ SOLN: 0.5000 mg | INTRAMUSCULAR | Status: DC | PRN

## 2013-08-11 MED ORDER — HYDROCODONE-ACETAMINOPHEN 7.5-325 MG PO TABS
1.0000 | ORAL_TABLET | ORAL | Status: DC
  Administered 2013-08-12: 1 via ORAL
  Administered 2013-08-12 – 2013-08-14 (×6): 2 via ORAL
  Administered 2013-08-14: 1 via ORAL
  Administered 2013-08-14 – 2013-08-15 (×5): 2 via ORAL
  Administered 2013-08-15: 1 via ORAL
  Filled 2013-08-11 (×4): qty 2
  Filled 2013-08-11: qty 1
  Filled 2013-08-11 (×2): qty 2
  Filled 2013-08-11 (×2): qty 1
  Filled 2013-08-11 (×7): qty 2

## 2013-08-11 SURGICAL SUPPLY — 75 items
BANDAGE ESMARK 6X9 LF (GAUZE/BANDAGES/DRESSINGS) ×1 IMPLANT
BLADE SAW SGTL 13.0X1.19X90.0M (BLADE) ×2 IMPLANT
BLADE SAW SGTL 13X75X1.27 (BLADE) ×2 IMPLANT
BNDG ELASTIC 6X10 VLCR STRL LF (GAUZE/BANDAGES/DRESSINGS) ×2 IMPLANT
BNDG ESMARK 6X9 LF (GAUZE/BANDAGES/DRESSINGS) ×2
BOWL SMART MIX CTS (DISPOSABLE) ×2 IMPLANT
CAPT RP KNEE ×2 IMPLANT
CEMENT HV SMART SET (Cement) ×4 IMPLANT
CLOTH BEACON ORANGE TIMEOUT ST (SAFETY) IMPLANT
COVER SURGICAL LIGHT HANDLE (MISCELLANEOUS) ×2 IMPLANT
CUFF TOURNIQUET SINGLE 34IN LL (TOURNIQUET CUFF) ×2 IMPLANT
CUFF TOURNIQUET SINGLE 44IN (TOURNIQUET CUFF) IMPLANT
DERMABOND ADVANCED (GAUZE/BANDAGES/DRESSINGS) ×1
DERMABOND ADVANCED .7 DNX12 (GAUZE/BANDAGES/DRESSINGS) ×1 IMPLANT
DRAPE EXTREMITY T 121X128X90 (DRAPE) ×2 IMPLANT
DRAPE POUCH INSTRU U-SHP 10X18 (DRAPES) ×2 IMPLANT
DRAPE PROXIMA HALF (DRAPES) ×4 IMPLANT
DRAPE U-SHAPE 47X51 STRL (DRAPES) ×2 IMPLANT
DRSG ADAPTIC 3X8 NADH LF (GAUZE/BANDAGES/DRESSINGS) ×2 IMPLANT
DRSG AQUACEL AG ADV 3.5X10 (GAUZE/BANDAGES/DRESSINGS) ×2 IMPLANT
DRSG AQUACEL AG ADV 3.5X14 (GAUZE/BANDAGES/DRESSINGS) ×2 IMPLANT
DRSG PAD ABDOMINAL 8X10 ST (GAUZE/BANDAGES/DRESSINGS) ×4 IMPLANT
DRSG TEGADERM 4X4.75 (GAUZE/BANDAGES/DRESSINGS) ×2 IMPLANT
ELECT REM PT RETURN 9FT ADLT (ELECTROSURGICAL) ×2
ELECTRODE REM PT RTRN 9FT ADLT (ELECTROSURGICAL) ×1 IMPLANT
EVACUATOR 1/8 PVC DRAIN (DRAIN) ×2 IMPLANT
FACESHIELD LNG OPTICON STERILE (SAFETY) ×4 IMPLANT
FACESHIELD STD STERILE (MASK) IMPLANT
FLOSEAL 10ML (HEMOSTASIS) IMPLANT
GAUZE SPONGE 2X2 8PLY STRL LF (GAUZE/BANDAGES/DRESSINGS) ×1 IMPLANT
GLOVE BIOGEL PI IND STRL 7.5 (GLOVE) ×1 IMPLANT
GLOVE BIOGEL PI IND STRL 8 (GLOVE) ×1 IMPLANT
GLOVE BIOGEL PI INDICATOR 7.5 (GLOVE) ×1
GLOVE BIOGEL PI INDICATOR 8 (GLOVE) ×1
GLOVE ECLIPSE 7.5 STRL STRAW (GLOVE) ×2 IMPLANT
GLOVE ECLIPSE 8.0 STRL XLNG CF (GLOVE) ×4 IMPLANT
GLOVE ORTHO TXT STRL SZ7.5 (GLOVE) ×4 IMPLANT
GLOVE SURG ORTHO 8.0 STRL STRW (GLOVE) ×2 IMPLANT
GLOVE SURG SS PI 6.0 STRL IVOR (GLOVE) ×4 IMPLANT
GOWN STRL NON-REIN LRG LVL3 (GOWN DISPOSABLE) ×8 IMPLANT
GOWN STRL REIN 3XL XLG LVL4 (GOWN DISPOSABLE) ×2 IMPLANT
HANDPIECE INTERPULSE COAX TIP (DISPOSABLE) ×1
IMMOBILIZER KNEE 20 (SOFTGOODS)
IMMOBILIZER KNEE 20 THIGH 36 (SOFTGOODS) IMPLANT
IMMOBILIZER KNEE 22 UNIV (SOFTGOODS) ×2 IMPLANT
IMMOBILIZER KNEE 24 THIGH 36 (MISCELLANEOUS) IMPLANT
IMMOBILIZER KNEE 24 UNIV (MISCELLANEOUS)
KIT BASIN OR (CUSTOM PROCEDURE TRAY) ×2 IMPLANT
KIT ROOM TURNOVER OR (KITS) ×2 IMPLANT
MANIFOLD NEPTUNE II (INSTRUMENTS) ×2 IMPLANT
MARKER SPHERE PSV REFLC THRD 5 (MARKER) ×6 IMPLANT
NEEDLE 18GX1X1/2 (RX/OR ONLY) (NEEDLE) ×4 IMPLANT
NS IRRIG 1000ML POUR BTL (IV SOLUTION) ×2 IMPLANT
PACK TOTAL JOINT (CUSTOM PROCEDURE TRAY) ×2 IMPLANT
PAD ARMBOARD 7.5X6 YLW CONV (MISCELLANEOUS) ×4 IMPLANT
PADDING CAST COTTON 6X4 STRL (CAST SUPPLIES) ×4 IMPLANT
PIN SCHANZ 4MM 130MM (PIN) IMPLANT
SET HNDPC FAN SPRY TIP SCT (DISPOSABLE) ×1 IMPLANT
SPONGE GAUZE 2X2 STER 10/PKG (GAUZE/BANDAGES/DRESSINGS) ×1
SPONGE GAUZE 4X4 12PLY (GAUZE/BANDAGES/DRESSINGS) ×2 IMPLANT
SPONGE LAP 18X18 X RAY DECT (DISPOSABLE) ×2 IMPLANT
STRIP CLOSURE SKIN 1/2X4 (GAUZE/BANDAGES/DRESSINGS) ×4 IMPLANT
SUCTION FRAZIER TIP 10 FR DISP (SUCTIONS) ×2 IMPLANT
SUT MNCRL AB 4-0 PS2 18 (SUTURE) ×2 IMPLANT
SUT VIC AB 1 CT1 27 (SUTURE) ×3
SUT VIC AB 1 CT1 27XBRD ANBCTR (SUTURE) ×3 IMPLANT
SUT VIC AB 2-0 CT1 27 (SUTURE) ×3
SUT VIC AB 2-0 CT1 TAPERPNT 27 (SUTURE) ×3 IMPLANT
SUT VLOC 180 0 24IN GS25 (SUTURE) ×2 IMPLANT
SYR 50ML SLIP (SYRINGE) ×2 IMPLANT
TOWEL OR 17X24 6PK STRL BLUE (TOWEL DISPOSABLE) ×2 IMPLANT
TOWEL OR 17X26 10 PK STRL BLUE (TOWEL DISPOSABLE) ×2 IMPLANT
TRAY FOLEY CATH 16FRSI W/METER (SET/KITS/TRAYS/PACK) IMPLANT
WATER STERILE IRR 1000ML POUR (IV SOLUTION) ×6 IMPLANT
WRAP KNEE MAXI GEL POST OP (GAUZE/BANDAGES/DRESSINGS) ×2 IMPLANT

## 2013-08-11 NOTE — Interval H&P Note (Signed)
History and Physical Interval Note:  08/11/2013 4:57 PM  Christopher [name]  has presented today for surgery, with the diagnosis of RIGHT KNEE OA  The various methods of treatment have been discussed with the patient and family. After consideration of risks, benefits and other options for treatment, the patient has consented to  Procedure(s): RIGHT TOTAL KNEE ARTHROPLASTY (Right) as a surgical intervention .  The patient's history has been reviewed, patient examined, no change in status, stable for surgery.  I have reviewed the patient's chart and labs.  Questions were answered to the patient's satisfaction.     Shelda Pal

## 2013-08-11 NOTE — Anesthesia Procedure Notes (Addendum)
Spinal   Spinal  Patient location during procedure: OR Start time: 08/11/2013 5:05 PM End time: 08/11/2013 5:10 PM Staffing Anesthesiologist: Noreene Larsson, Scorpio Fortin Performed by: anesthesiologist  Preanesthetic Checklist Completed: patient identified, site marked, surgical consent, pre-op evaluation, timeout performed, IV checked, risks and benefits discussed and monitors and equipment checked Spinal Block Patient position: right lateral decubitus Prep: ChloraPrep Patient monitoring: heart rate, cardiac monitor, continuous pulse ox and blood pressure Approach: midline Location: L4-5 Injection technique: single-shot Needle Needle type: Tuohy  Needle gauge: 22 G Needle length: 9 cm Needle insertion depth: 6 cm Assessment Sensory level: T8 Additional Notes 10 mg 0.75% Bupivacaine injected easily, Clear CSF

## 2013-08-11 NOTE — Anesthesia Postprocedure Evaluation (Signed)
Anesthesia Post Note  Patient: Christopher [name]  Procedure(s) Performed: Procedure(s) (LRB): RIGHT TOTAL KNEE ARTHROPLASTY (Right)  Anesthesia type: general  Patient location: PACU  Post pain: Pain level controlled  Post assessment: Patient's Cardiovascular Status Stable  Last Vitals:  Filed Vitals:   08/11/13 2103  BP:   Pulse: 51  Temp: 36.4 C  Resp: 15    Post vital signs: Reviewed and stable  Level of consciousness: sedated  Complications: No apparent anesthesia complications

## 2013-08-11 NOTE — Anesthesia Preprocedure Evaluation (Addendum)
Anesthesia Evaluation  Patient identified by MRN, date of birth, ID band Patient awake    Reviewed: Allergy & Precautions, H&P , NPO status , Patient's Chart, lab work & pertinent test results  Airway Mallampati: II      Dental  (+) Partial Upper and Partial Lower   Pulmonary neg pulmonary ROS,  breath sounds clear to auscultation        Cardiovascular hypertension, + CAD and + Past MI Rhythm:Regular Rate:Normal     Neuro/Psych    GI/Hepatic Neg liver ROS, GERD-  ,  Endo/Other    Renal/GU DialysisRenal disease   Hx BPH with CA    Musculoskeletal   Abdominal   Peds  Hematology   Anesthesia Other Findings   Reproductive/Obstetrics                          Anesthesia Physical Anesthesia Plan  ASA: III  Anesthesia Plan: Spinal and MAC   Post-op Pain Management:    Induction:   Airway Management Planned:   Additional Equipment:   Intra-op Plan:   Post-operative Plan:   Informed Consent: I have reviewed the patients History and Physical, chart, labs and discussed the procedure including the risks, benefits and alternatives for the proposed anesthesia with the patient or authorized representative who has indicated his/her understanding and acceptance.     Plan Discussed with: CRNA and Anesthesiologist  Anesthesia Plan Comments:        Anesthesia Quick Evaluation

## 2013-08-11 NOTE — Progress Notes (Signed)
Pt has pre existing AVF in RL arm. Positive thrill & bruit.

## 2013-08-11 NOTE — Preoperative (Signed)
Beta Blockers   Reason not to administer Beta Blockers:Not Applicable, pt took metoprolol this am 

## 2013-08-11 NOTE — Transfer of Care (Signed)
Immediate Anesthesia Transfer of Care Note  Patient: Christopher [name]  Procedure(s) Performed: Procedure(s): RIGHT TOTAL KNEE ARTHROPLASTY (Right)  Patient Location: PACU  Anesthesia Type:Spinal  Level of Consciousness: awake, alert , oriented and patient cooperative  Airway & Oxygen Therapy: Patient Spontanous Breathing  Post-op Assessment: Report given to PACU RN, Post -op Vital signs reviewed and stable and Patient moving all extremities X 4  Post vital signs: Reviewed and stable  Complications: No apparent anesthesia complications

## 2013-08-11 NOTE — Progress Notes (Signed)
Pt wanting to know what plan is for HD in the coming days. Dr Thomasena Edis (on call for [facility]), paged. PA Aggie Hacker ret the page. He will follow up with dr Charlann Boxer tomorrow re this.

## 2013-08-12 LAB — BASIC METABOLIC PANEL
BUN: 46 mg/dL — ABNORMAL HIGH (ref 6–23)
Chloride: 92 mEq/L — ABNORMAL LOW (ref 96–112)
GFR calc Af Amer: 13 mL/min — ABNORMAL LOW (ref >90)
Potassium: 4.4 mEq/L (ref 3.5–5.1)
Sodium: 133 mEq/L — ABNORMAL LOW (ref 135–145)

## 2013-08-12 LAB — CBC
HCT: 28.1 % — ABNORMAL LOW (ref 39.0–52.0)
Platelets: 174 10*3/uL (ref 150–400)
RDW: 14.4 % (ref 11.5–15.5)
WBC: 7.3 10*3/uL (ref 4.0–10.5)

## 2013-08-12 MED ORDER — DARBEPOETIN ALFA-POLYSORBATE 40 MCG/0.4ML IJ SOLN
40.0000 ug | INTRAMUSCULAR | Status: DC
  Administered 2013-08-13: 40 ug via INTRAVENOUS
  Filled 2013-08-12: qty 0.4

## 2013-08-12 MED ORDER — ESOMEPRAZOLE MAGNESIUM 20 MG PO CPDR
20.0000 mg | DELAYED_RELEASE_CAPSULE | Freq: Every day | ORAL | Status: DC
  Administered 2013-08-12 – 2013-08-14 (×3): 20 mg via ORAL
  Filled 2013-08-12 (×5): qty 1

## 2013-08-12 MED ORDER — DOXERCALCIFEROL 4 MCG/2ML IV SOLN
2.0000 ug | INTRAVENOUS | Status: DC
Start: 2013-08-13 — End: 2013-08-15
  Administered 2013-08-13: 2 ug via INTRAVENOUS
  Filled 2013-08-12 (×2): qty 2

## 2013-08-12 NOTE — Op Note (Signed)
NAME:  Christopher [name]                      MEDICAL RECORD NO.:  [id number]                             FACILITY:  [facility]      PHYSICIAN:  Madlyn Frankel. Charlann Boxer, M.D.  DATE OF BIRTH:  May 29, 1946      DATE OF PROCEDURE:  08/12/2013                                     OPERATIVE REPORT         PREOPERATIVE DIAGNOSIS:  Right knee osteoarthritis.      POSTOPERATIVE DIAGNOSIS:  Right knee osteoarthritis with significant ligament laxity     FINDINGS:  The patient was noted to have complete loss of cartilage and   bone-on-bone arthritis with associated osteophytes in all three compartments of   the knee with a significant synovitis and associated effusion.      PROCEDURE:  Right total knee replacement.      COMPONENTS USED:  DePuy rotating platform posterior stabilized knee   system, a size 5 femur, 4 tibia, 17.5 mm insert, and 41 patellar   button.      SURGEON:  Madlyn Frankel. Charlann Boxer, M.D.      ASSISTANT:  Lanney Gins, PA-C.      ANESTHESIA:  Spinal.      SPECIMENS:  None.      COMPLICATION:  None.      DRAINS:  One Hemovac.  EBL: 300cc      TOURNIQUET TIME:   Total Tourniquet Time Documented: Thigh (Right) - 7 minutes Thigh (Right) - 21 minutes Total: Thigh (Right) - 28 minutes  .      The patient was stable to the recovery room.      INDICATION FOR PROCEDURE:  Christopher [name] is a 67 y.o. male patient of   mine.  The patient had been seen, evaluated, and treated conservatively in the   office with medication, activity modification, and injections.  The patient had   radiographic changes of bone-on-bone arthritis with endplate sclerosis and osteophytes noted.      The patient failed conservative measures including medication, injections, and activity modification, and at this point was ready for more definitive measures.   Based on the radiographic changes and failed conservative measures, the patient   decided to proceed with total knee replacement.  Risks of infection,   DVT, component failure, need for revision surgery, postop course, and   expectations were all   discussed and reviewed.  Consent was obtained for benefit of pain   relief.      PROCEDURE IN DETAIL:  The patient was brought to the operative theater.   Once adequate anesthesia, preoperative antibiotics, 2 gm of Ancef administered, the patient was positioned supine with the right thigh tourniquet placed.  The  right lower extremity was prepped and draped in sterile fashion.  A time-   out was performed identifying the patient, planned procedure, and   extremity.      The right lower extremity was placed in the [facility] leg holder.  The leg was   exsanguinated, tourniquet elevated to 250 mmHg.  A midline incision was   made followed by median parapatellar arthrotomy.  Following initial  exposure, attention was first directed to the patella.  Precut   measurement was noted to be 24 mm.  I resected down to 14-15 mm and used a   41 patellar button to restore patellar height as well as cover the cut   surface.      The lug holes were drilled and a metal shim was placed to protect the   patella from retractors and saw blades.      At this point, attention was now directed to the femur.  The femoral   canal was opened with a drill, irrigated to try to prevent fat emboli.  An   intramedullary rod was passed at 5 degrees valgus, 8 mm of bone was   resected off the distal femur due to his pre-operative hyperextension passively.  Following this resection, the tibia was   subluxated anteriorly.  Using the extramedullary guide, 6 mm of bone was resected off   the proximal lateral tibia.  We confirmed the gap would be   stable medially and laterally with at least the 10 mm insert as well as confirmed   the cut was perpendicular in the coronal plane, checking with an alignment rod.      Once this was done, I sized the femur to be a size 5 in the anterior-   posterior dimension, chose a standard  component based on medial and   lateral dimension.  The size 5 rotation block was then pinned in   position anterior referenced using the C-clamp to set rotation.  The   anterior, posterior, and  chamfer cuts were made without difficulty nor   notching making certain that I was along the anterior cortex to help   with flexion gap stability.      The final box cut was made off the lateral aspect of distal femur.      At this point, the tibia was sized to be a size 4, the size 4 tray was   then pinned in position through the medial third of the tubercle,   drilled, and keel punched.  Trial reduction was now carried with a 5 femur,  4 tibia, up to 17.5 mm insert, and the 41 patella botton.  The knee was brought to   extension, full extension with good flexion stability with this insert in place.  The patella   tracking through the trochlea without application of pressure.  Given   all these findings, the trial components removed.  Final components were   opened and cement was mixed.  The knee was irrigated with normal saline   solution and pulse lavage.  The synovial lining was   then injected with 0.25% Marcaine with epinephrine and 1 cc of Toradol,   total of 61 cc.      The knee was irrigated.  Final implants were then cemented onto clean and   dried cut surfaces of bone with the knee brought to extension with a 17.5 mm trial insert.      Once the cement had fully cured, the excess cement was removed   throughout the knee.  I confirmed I was satisfied with the range of   motion and stability, and the final 17.5 mm PS insert was chosen.  It was   placed into the knee.      The tourniquet had been used intermittently and was eventually let down after 28 minutes.  The medium Hemovac drain was placed deep.  The   extensor mechanism was  then reapproximated using #1 Vicryl with the knee   in flexion.  The   remaining wound was closed with 2-0 Vicryl and running 4-0 Monocryl.   The knee was  cleaned, dried, dressed sterilely using Dermabond and   Aquacel dressing.  Drain site dressed separately.  The patient was then   brought to recovery room in stable condition, tolerating the procedure   well.   Please note that Physician Assistant, Lanney Gins, was present for the entirety of the case, and was utilized for pre-operative positioning, peri-operative retractor management, general facilitation of the procedure.  He was also utilized for primary wound closure at the end of the case.              Madlyn Frankel Charlann Boxer, M.D.

## 2013-08-12 NOTE — Evaluation (Signed)
Physical Therapy Evaluation Patient Details Name: Christopher [name] MRN: [id number] DOB: Jun 19, 1946 Today's Date: 08/12/2013 Time: 0902-0925 PT Time Calculation (min): 23 min  PT Assessment / Plan / Recommendation History of Present Illness  Pt admitted for R TKA on 08/11/13.  Pt also has severe L knee recurvatum and plans on having it replaced in future.  Pt receives dialysis 3x/week.  Clinical Impression  Pt agreeable to PT and should progress well towards d/c plan of SNF.  Pt with limited range/strength on R Knee due to TKA and severe hyperextension/recurvatum on L knee causing decreased stability in standing.  Will follow pt acutely to address deficits prior to d/c to SNF.    PT Assessment  Patient needs continued PT services    Follow Up Recommendations  SNF    Does the patient have the potential to tolerate intense rehabilitation      Barriers to Discharge Decreased caregiver support      Equipment Recommendations  None recommended by PT    Recommendations for Other Services     Frequency 7X/week    Precautions / Restrictions Precautions Precautions: Knee Restrictions Weight Bearing Restrictions: Yes RLE Weight Bearing: Weight bearing as tolerated   Pertinent Vitals/Pain 4/10 with activity      Mobility  Bed Mobility Bed Mobility: Supine to Sit Supine to Sit: 4: Min assist Transfers Transfers: Sit to Stand Sit to Stand: 1: +2 Total assist Sit to Stand: Patient Percentage: 50% Details for Transfer Assistance: In standing, pt with very wide BOS and required cueing to get feet under BOS.  Severe recurvatum on L LE and needed to be blocked posteriorly. Ambulation/Gait Ambulation/Gait Assistance: 1: +2 Total assist Ambulation/Gait: Patient Percentage: 50% Ambulation Distance (Feet): 3 Feet Assistive device: Rolling walker Ambulation/Gait Assistance Details: Pt went from bed > w/c.  Severe recurvatum on L and needed to be given support posteriorly. Gait Pattern:  Wide base of support    Exercises Total Joint Exercises Ankle Circles/Pumps: AROM;10 reps Heel Slides: AAROM;10 reps;Supine   PT Diagnosis: Difficulty walking;Generalized weakness  PT Problem List: Decreased strength;Decreased range of motion;Decreased activity tolerance;Decreased mobility;Decreased knowledge of use of DME PT Treatment Interventions: Gait training;Functional mobility training;Therapeutic activities;Therapeutic exercise;Balance training     PT Goals(Current goals can be found in the care plan section) Acute Rehab PT Goals Patient Stated Goal: To go to SNF for rehab and get stronger before he goes home alone. PT Goal Formulation: With patient Time For Goal Achievement: 08/19/13 Potential to Achieve Goals: Good  Visit Information  Last PT Received On: 08/12/13 Assistance Needed: +2 PT/OT Co-Evaluation/Treatment: Yes History of Present Illness: Pt admitted for R TKA on 08/11/13.  Pt also has severe L knee recurvatum and plans on having it replaced in future.  Pt receives dialysis 3x/week.       Prior Functioning  Home Living Family/patient expects to be discharged to:: Skilled nursing facility Living Arrangements: Alone Prior Function Level of Independence: Independent with assistive device(s) Communication Communication: No difficulties    Cognition  Cognition Arousal/Alertness: Awake/alert Behavior During Therapy: WFL for tasks assessed/performed Overall Cognitive Status: Within Functional Limits for tasks assessed    Extremity/Trunk Assessment Upper Extremity Assessment Upper Extremity Assessment: Defer to OT evaluation Lower Extremity Assessment Lower Extremity Assessment: RLE deficits/detail;LLE deficits/detail RLE Deficits / Details: Decreased R knee ROM due to TKA with limited range ~-15 to 40 degrees AROM RLE: Unable to fully assess due to pain LLE Deficits / Details: knee ROM limited against gravity, but in WB with  recurvatum.   Balance    End of  Session PT - End of Session Equipment Utilized During Treatment: Gait belt Activity Tolerance: Patient tolerated treatment well Patient left: in chair Nurse Communication: Mobility status;Other (comment) (L knee recurvatum)  GP     Elfriede Bonini LUBECK 08/12/2013, 10:37 AM

## 2013-08-12 NOTE — Evaluation (Signed)
Occupational Therapy Evaluation Patient Details Name: Christopher [name] MRN: [id number] DOB: 10-21-1946 Today's Date: 08/12/2013 Time: 4098-1191 OT Time Calculation (min): 23 min  OT Assessment / Plan / Recommendation History of present illness Pt admitted for R TKA on 08/11/13.  Pt also has severe L knee recurvatum and plans on having it replaced in future.  Pt receives dialysis 3x/week.   Clinical Impression   Pt s/p R TKA. Will continue to follow pt acutely in order to address below problem list. Recommending SNF for d/c planning to progress rehab before eventual return home alone.    OT Assessment  Patient needs continued OT Services    Follow Up Recommendations  SNF    Barriers to Discharge Decreased caregiver support pt lives alone  Equipment Recommendations  3 in 1 bedside comode    Recommendations for Other Services    Frequency  Min 2X/week    Precautions / Restrictions Precautions Precautions: Knee Restrictions Weight Bearing Restrictions: Yes RLE Weight Bearing: Weight bearing as tolerated   Pertinent Vitals/Pain See vitals    ADL  Eating/Feeding: Performed;Modified independent Where Assessed - Eating/Feeding: Chair Upper Body Bathing: Simulated;Set up Where Assessed - Upper Body Bathing: Unsupported sitting Lower Body Bathing: Simulated;+2 Total assistance Lower Body Bathing: Patient Percentage: 50% Where Assessed - Lower Body Bathing: Supported sit to stand Upper Body Dressing: Performed;Set up Where Assessed - Upper Body Dressing: Unsupported sitting Lower Body Dressing: Performed;+2 Total assistance Lower Body Dressing: Patient Percentage: 50% Where Assessed - Lower Body Dressing: Supported sit to stand Toilet Transfer: Simulated;+2 Total assistance Toilet Transfer: Patient Percentage: 50% Toilet Transfer Method: Surveyor, minerals:  (bed<>chair) Equipment Used: Gait belt;Rolling walker Transfers/Ambulation Related to ADLs: +2 total  assist for SPT from bed to chair. Assist for balance due to pt's with large base of support and to block L knee posteriorly (severe recurvatum).    OT Diagnosis: Generalized weakness;Acute pain  OT Problem List: Decreased strength;Decreased activity tolerance;Impaired balance (sitting and/or standing);Decreased knowledge of use of DME or AE;Pain OT Treatment Interventions: Self-care/ADL training;DME and/or AE instruction;Therapeutic activities;Patient/family education;Balance training   OT Goals(Current goals can be found in the care plan section) Acute Rehab OT Goals Patient Stated Goal: To go to SNF for rehab and get stronger before he goes home alone. OT Goal Formulation: With patient Time For Goal Achievement: 08/19/13 Potential to Achieve Goals: Good  Visit Information  Last OT Received On: 08/12/13 Assistance Needed: +2 History of Present Illness: Pt admitted for R TKA on 08/11/13.  Pt also has severe L knee recurvatum and plans on having it replaced in future.  Pt receives dialysis 3x/week.       Prior Functioning     Home Living Family/patient expects to be discharged to:: Skilled nursing facility Living Arrangements: Alone Prior Function Level of Independence: Independent with assistive device(s) Communication Communication: No difficulties         Vision/Perception     Cognition  Cognition Arousal/Alertness: Awake/alert Behavior During Therapy: WFL for tasks assessed/performed Overall Cognitive Status: Within Functional Limits for tasks assessed    Extremity/Trunk Assessment Upper Extremity Assessment Upper Extremity Assessment: Generalized weakness Lower Extremity Assessment Lower Extremity Assessment: RLE deficits/detail;LLE deficits/detail RLE Deficits / Details: Decreased R knee ROM due to TKA with limited range ~-15 to 40 degrees AROM RLE: Unable to fully assess due to pain LLE Deficits / Details: knee ROM limited against gravity, but in WB with  recurvatum.     Mobility Bed Mobility Bed Mobility: Supine to  Sit;Sitting - Scoot to Edge of Bed Supine to Sit: 4: Min assist Sitting - Scoot to Edge of Bed: 4: Min guard;With rail Transfers Transfers: Sit to Stand;Stand to Sit Sit to Stand: 1: +2 Total assist;From bed;With upper extremity assist Sit to Stand: Patient Percentage: 50% Stand to Sit: 1: +2 Total assist;To chair/3-in-1;With armrests Stand to Sit: Patient Percentage: 50% Details for Transfer Assistance: In standing, pt with very wide BOS and required cueing to get feet under BOS.  Severe recurvatum on L LE and needed to be blocked posteriorly.     Exercise    Balance     End of Session OT - End of Session Equipment Utilized During Treatment: Gait belt;Rolling walker Activity Tolerance: Patient tolerated treatment well Patient left: in chair;with call bell/phone within reach Nurse Communication: Mobility status;Patient requests pain meds  GO    08/12/2013 Cipriano Mile OTR/L Pager [phone] Office [phone]   Cipriano Mile 08/12/2013, 2:16 PM

## 2013-08-12 NOTE — Progress Notes (Signed)
UR COMPLETED  

## 2013-08-12 NOTE — Consult Note (Signed)
Christopher [name] is an 67 y.o. black male referred by Dr Charlann Boxer   Chief Complaint: ESRD management, anemia Sec HPTH HPI: 67 yo BM with ESRD on HD in [place] TTS, 3hr37min, BFR 450 EDW 64.5kg.  Last HD was Monday.  He is SP RT TKR on 08/11/13.  Post op course uneventful so far.  Past Medical History  Diagnosis Date  . SLE-Sjogren overlap syndrome   . BPH (benign prostatic hypertrophy)   . Esophageal reflux   . Unspecified essential hypertension   . Osteoarthritis   . History of pancreatitis   . Bladder outlet obstruction     calculi  . History of non-ST elevation myocardial infarction (NSTEMI) 2005  . Dialysis patient   . Headache(784.0)   . Chronic kidney disease     Hemodialysis in [place].  TUe, Thurs, Sat  . Cancer 2008    prostate cancer.  Treated with radiation.    Past Surgical History  Procedure Laterality Date  . Dialysis fistula creation Right 2011    arm  . Cardiac catheterization  08/27/2007    couple of those  . Renal biopsy Left   . Insertion of dialysis catheter    . Removal of a dialysis catheter    . Av fistula placement Left     x2  . Av fistula repair Left     ligation    Family History  Problem Relation Age of Onset  . Heart attack Mother 83  . Heart attack Brother 47  . Hypertension     Social History:  reports that he quit smoking about 29 years ago. His smoking use included Cigarettes. He has a 20 pack-year smoking history. He has never used smokeless tobacco. He reports that he does not drink alcohol or use illicit drugs.  Allergies: No Known Allergies  Medications Prior to Admission  Medication Sig Dispense Refill  . amLODipine (NORVASC) 10 MG tablet Take 10 mg by mouth every morning.       . enalapril (VASOTEC) 20 MG tablet Take 20 mg by mouth 2 (two) times daily.       Marland Kitchen esomeprazole (NEXIUM) 20 MG capsule Take 20 mg by mouth daily before breakfast.      . metoprolol (LOPRESSOR) 100 MG tablet Take 100 mg by mouth 2 (two) times daily.          Lab Results: UA: ND   Recent Labs  08/11/13 1310 08/12/13 0500  WBC 3.8* 7.3  HGB 12.1* 9.5*  HCT 35.4* 28.1*  PLT 134* 174   BMET  Recent Labs  08/11/13 1310 08/12/13 0500  NA 131* 133*  K 3.8 4.4  CL 90* 92*  CO2 27 29  GLUCOSE 78 192*  BUN 31* 46*  CREATININE 3.92* 4.93*  CALCIUM 9.4 9.5   LFT No results found for this basename: PROT, ALBUMIN, AST, ALT, ALKPHOS, BILITOT, BILIDIR, IBILI,  in the last 72 hours No results found.  ROS: No change in vision No SOB No CP No ABd pain Pain in Rt knee No dysuria  Rest ROS neg  PHYSICAL EXAM: Blood pressure 145/86, pulse 50, temperature 98.7 F (37.1 C), temperature source Oral, resp. rate 19, SpO2 99.00%. HEENT: PERRLA EOMI NECK:No JVD LUNGS:clear CARDIAC:RRR ABD:+BS NTND EXT:Rt kness wrapped.  No edema.  Rt forearm AVF + bruit and thrill NEURO:CNI M&SI OX3 no asterixis  Assessment: 1. ESRD 2. Anemia 3. Sec HPTH 4. HTN 5. SP Rt TKR PLAN: 1. HD in AM 2. Resume EPO 3.  Resume hectorol 4. On all his pre adm po meds   Ward Boissonneault T 08/12/2013, 1:00 PM

## 2013-08-12 NOTE — Progress Notes (Signed)
Physical Therapy Treatment Patient Details Name: Christopher [name] MRN: [id number] DOB: Oct 23, 1946 Today's Date: 08/12/2013 Time: 8657-8469 PT Time Calculation (min): 16 min  PT Assessment / Plan / Recommendation  History of Present Illness Pt admitted for R TKA on 08/11/13.  Pt also has severe L knee recurvatum and plans on having it replaced in future.  Pt receives dialysis 3x/week.   PT Comments   Pt up in chair and agreeable to seated B LE there ex.  Follow Up Recommendations  SNF     Does the patient have the potential to tolerate intense rehabilitation     Barriers to Discharge Decreased caregiver support      Equipment Recommendations  None recommended by PT    Recommendations for Other Services    Frequency 7X/week   Progress towards PT Goals Progress towards PT goals: Progressing toward goals  Plan Current plan remains appropriate    Precautions / Restrictions Precautions Precautions: Knee Restrictions Weight Bearing Restrictions: Yes RLE Weight Bearing: Weight bearing as tolerated   Pertinent Vitals/Pain 3/10         Exercises Total Joint Exercises Ankle Circles/Pumps: AROM;Both;20 reps;Seated Towel Squeeze: Strengthening;Both;20 reps;Seated Heel Slides: AAROM;10 reps;Supine Hip ABduction/ADduction: Strengthening;20 reps;Both;Seated Long Arc Quad: Strengthening;AAROM;Both;20 reps;Seated   PT Diagnosis: Difficulty walking;Generalized weakness  PT Problem List: Decreased strength;Decreased range of motion;Decreased activity tolerance;Decreased mobility;Decreased knowledge of use of DME PT Treatment Interventions: Gait training;Functional mobility training;Therapeutic activities;Therapeutic exercise;Balance training   PT Goals (current goals can now be found in the care plan section) Acute Rehab PT Goals Patient Stated Goal: To go to SNF for rehab and get stronger before he goes home alone. PT Goal Formulation: With patient Time For Goal Achievement:  08/19/13 Potential to Achieve Goals: Good  Visit Information  Last PT Received On: 08/12/13 Assistance Needed: +2 PT/OT Co-Evaluation/Treatment: Yes History of Present Illness: Pt admitted for R TKA on 08/11/13.  Pt also has severe L knee recurvatum and plans on having it replaced in future.  Pt receives dialysis 3x/week.    Subjective Data  Patient Stated Goal: To go to SNF for rehab and get stronger before he goes home alone.   Cognition  Cognition Arousal/Alertness: Awake/alert Behavior During Therapy: WFL for tasks assessed/performed Overall Cognitive Status: Within Functional Limits for tasks assessed    Balance     End of Session PT - End of Session Equipment Utilized During Treatment: Gait belt Activity Tolerance: Patient tolerated treatment well Patient left: in chair Nurse Communication: Mobility status;Other (comment) (L knee recurvatum)   GP     Christopher [name] 08/12/2013, 12:04 PM

## 2013-08-12 NOTE — Progress Notes (Signed)
Patient ID: Christopher [name], male   DOB: May 11, 1946, 67 y.o.   MRN: [id number] Subjective: 1 Day Post-Op Procedure(s) (LRB): RIGHT TOTAL KNEE ARTHROPLASTY (Right)    Patient reports pain as mild. Appears to be doing well.  No major issues with pain overnight.  No events, no chest pain, shortness of breath  Objective:   VITALS:   Filed Vitals:   08/12/13 0605  BP: 145/86  Pulse: 50  Temp: 98.7 F (37.1 C)  Resp: 18    Neurovascular intact Incision: dressing C/D/I  HV removed this am  LABS  Recent Labs  08/11/13 1310 08/12/13 0500  HGB 12.1* 9.5*  HCT 35.4* 28.1*  WBC 3.8* 7.3  PLT 134* 174     Recent Labs  08/11/13 1310 08/12/13 0500  NA 131* 133*  K 3.8 4.4  BUN 31* 46*  CREATININE 3.92* 4.93*  GLUCOSE 78 192*     Recent Labs  08/11/13 1310  INR 0.99     Assessment/Plan: 1 Day Post-Op Procedure(s) (LRB): RIGHT TOTAL KNEE ARTHROPLASTY (Right)   Advance diet Up with therapy, motion, strengthening  D/C IV fluids with good POs  Discharge to SNF, wants [facility] in [place], most likely Friday due to insurance  Will contact [facility] for consult management of his HD  HD [place], [place], Sat in [place] Nephrologist Raven Voora - [facility]

## 2013-08-13 ENCOUNTER — Encounter (HOSPITAL_COMMUNITY): Payer: Self-pay | Admitting: Orthopedic Surgery

## 2013-08-13 LAB — BASIC METABOLIC PANEL
CO2: 25 mEq/L (ref 19–32)
Calcium: 8.6 mg/dL (ref 8.4–10.5)
Chloride: 90 mEq/L — ABNORMAL LOW (ref 96–112)
Potassium: 4.6 mEq/L (ref 3.5–5.1)
Sodium: 128 mEq/L — ABNORMAL LOW (ref 135–145)

## 2013-08-13 LAB — CBC
Hemoglobin: 7.5 g/dL — ABNORMAL LOW (ref 13.0–17.0)
MCH: 32.1 pg (ref 26.0–34.0)
Platelets: 171 10*3/uL (ref 150–400)
RBC: 2.34 MIL/uL — ABNORMAL LOW (ref 4.22–5.81)
WBC: 8.4 10*3/uL (ref 4.0–10.5)

## 2013-08-13 LAB — PREPARE RBC (CROSSMATCH)

## 2013-08-13 MED ORDER — DARBEPOETIN ALFA-POLYSORBATE 40 MCG/0.4ML IJ SOLN
INTRAMUSCULAR | Status: AC
  Administered 2013-08-13: 40 ug via INTRAVENOUS
  Filled 2013-08-13: qty 0.4

## 2013-08-13 MED ORDER — POLYETHYLENE GLYCOL 3350 17 G PO PACK: 17.0000 g | PACK | Freq: Two times a day (BID) | ORAL | Status: DC

## 2013-08-13 MED ORDER — TIZANIDINE HCL 4 MG PO TABS: 4.0000 mg | ORAL_TABLET | Freq: Four times a day (QID) | ORAL | Status: AC | PRN

## 2013-08-13 MED ORDER — DSS 100 MG PO CAPS: 100.0000 mg | ORAL_CAPSULE | Freq: Two times a day (BID) | ORAL | Status: DC

## 2013-08-13 MED ORDER — INFLUENZA VAC SPLIT QUAD 0.5 ML IM SUSP
0.5000 mL | INTRAMUSCULAR | Status: AC
  Administered 2013-08-14: 0.5 mL via INTRAMUSCULAR
  Filled 2013-08-13: qty 0.5

## 2013-08-13 MED ORDER — HYDROCODONE-ACETAMINOPHEN 7.5-325 MG PO TABS: 1.0000 | ORAL_TABLET | ORAL | Status: DC | PRN

## 2013-08-13 MED ORDER — DOXERCALCIFEROL 4 MCG/2ML IV SOLN
INTRAVENOUS | Status: AC
  Administered 2013-08-13: 2 ug via INTRAVENOUS
  Filled 2013-08-13: qty 2

## 2013-08-13 NOTE — Progress Notes (Signed)
Hemodialysis-See Flowsheet Dr. Briant Cedar paged regarding Hgb results =7.5 this am. Asked to page primary with this information. Dr. Charlann Boxer paged at [phone].

## 2013-08-13 NOTE — Progress Notes (Signed)
   Subjective: 2 Days Post-Op Procedure(s) (LRB): RIGHT TOTAL KNEE ARTHROPLASTY (Right)   Patient reports pain as mild, pain well controlled. No events throughout the night.  Objective:   VITALS:   Filed Vitals:   08/13/13 1236  BP: 159/68  Pulse: 57  Temp: 97.9 F (36.6 C)  Resp: 18    Neurovascular intact Dorsiflexion/Plantar flexion intact Incision: dressing C/D/I No cellulitis present Compartment soft  LABS  Recent Labs  08/11/13 1310 08/12/13 0500 08/13/13 0633  HGB 12.1* 9.5* 7.5*  HCT 35.4* 28.1* 21.4*  WBC 3.8* 7.3 8.4  PLT 134* 174 171     Recent Labs  08/11/13 1310 08/12/13 0500 08/13/13 0633  NA 131* 133* 128*  K 3.8 4.4 4.6  BUN 31* 46* 64*  CREATININE 3.92* 4.93* 5.85*  GLUCOSE 78 192* 82     Assessment/Plan: 2 Days Post-Op Procedure(s) (LRB): RIGHT TOTAL KNEE ARTHROPLASTY (Right) Encouraged to keep the leg as straight as possible and to work on extension exercises. Up with therapy Discharge to SNF eventually, when ready.    Expected ABLA  Was to receive a unit of blood with dialysis this morning and will observe.       Anastasio Auerbach Ceciley Buist   PAC  08/13/2013, 6:31 PM

## 2013-08-13 NOTE — Progress Notes (Signed)
Physical Therapy Treatment Patient Details Name: Christopher [name] MRN: [id number] DOB: 09-03-1946 Today's Date: 08/13/2013 Time: 0865-7846 PT Time Calculation (min): 17 min  PT Assessment / Plan / Recommendation  History of Present Illness Pt admitted for R TKA on 08/11/13.  Pt also has severe L knee recurvatum and plans on having it replaced in future.  Pt receives dialysis 3x/week.   PT Comments   Patient limited today by fatigue. Agreeable to get OOB to recliner. Patient had dialysis this morning and Hgb is 7.5. Will work on increasing ambulation tomorrows session  Follow Up Recommendations  SNF     Does the patient have the potential to tolerate intense rehabilitation     Barriers to Discharge        Equipment Recommendations  None recommended by PT    Recommendations for Other Services    Frequency 7X/week   Progress towards PT Goals Progress towards PT goals: Progressing toward goals  Plan Current plan remains appropriate    Precautions / Restrictions Precautions Precautions: Knee Restrictions Weight Bearing Restrictions: Yes RLE Weight Bearing: Weight bearing as tolerated   Pertinent Vitals/Pain no apparent distress     Mobility  Bed Mobility Supine to Sit: 4: Min assist Sitting - Scoot to Edge of Bed: 4: Min guard;With rail Details for Bed Mobility Assistance: A with R LE and trunk into sitting Transfers Transfers: Stand Pivot Transfers Sit to Stand: 1: +2 Total assist;From bed;With upper extremity assist Sit to Stand: Patient Percentage: 50% Stand to Sit: 1: +2 Total assist;To chair/3-in-1;With armrests Stand to Sit: Patient Percentage: 50% Stand Pivot Transfers: 1: +2 Total assist Stand Pivot Transfers: Patient Percentage: 30% Details for Transfer Assistance: COntinued with wide BOS. A for initiation of stand and to ensure balance and support of BLEs. Cues for safe technique and hand placement. A to shift weight of hips into recliner as patient unable to  take steps    Exercises Total Joint Exercises Heel Slides: AAROM;10 reps;Right Hip ABduction/ADduction: AAROM;Right;10 reps Straight Leg Raises: AAROM;Right;10 reps Long Arc Quad: AAROM;Right;10 reps   PT Diagnosis:    PT Problem List:   PT Treatment Interventions:     PT Goals (current goals can now be found in the care plan section)    Visit Information  Last PT Received On: 08/13/13 Assistance Needed: +2 History of Present Illness: Pt admitted for R TKA on 08/11/13.  Pt also has severe L knee recurvatum and plans on having it replaced in future.  Pt receives dialysis 3x/week.    Subjective Data      Cognition  Cognition Arousal/Alertness: Awake/alert Behavior During Therapy: WFL for tasks assessed/performed Overall Cognitive Status: Within Functional Limits for tasks assessed    Balance     End of Session PT - End of Session Equipment Utilized During Treatment: Gait belt Activity Tolerance: Patient limited by fatigue Patient left: in chair;with call bell/phone within reach Nurse Communication: Mobility status;Other (comment)   GP     Fredrich Birks 08/13/2013, 3:06 PM 08/13/2013 Fredrich Birks PTA [phone] pager [phone] office

## 2013-08-13 NOTE — Procedures (Signed)
Pt seen on HD.  Ap 180 Vp 200.  Hg down to 7.5.  SBP 115.  He says he plans to go to [facility] in [place] but I see no note from a SW or case Production designer, theatre/television/film

## 2013-08-14 LAB — CBC
HCT: 26.8 % — ABNORMAL LOW (ref 39.0–52.0)
RBC: 2.95 MIL/uL — ABNORMAL LOW (ref 4.22–5.81)
RDW: 16.5 % — ABNORMAL HIGH (ref 11.5–15.5)
WBC: 7.8 10*3/uL (ref 4.0–10.5)

## 2013-08-14 LAB — BASIC METABOLIC PANEL
BUN: 26 mg/dL — ABNORMAL HIGH (ref 6–23)
CO2: 29 mEq/L (ref 19–32)
Chloride: 92 mEq/L — ABNORMAL LOW (ref 96–112)
GFR calc Af Amer: 20 mL/min — ABNORMAL LOW (ref >90)
Potassium: 3.8 mEq/L (ref 3.5–5.1)

## 2013-08-14 LAB — TYPE AND SCREEN
ABO/RH(D): B POS
Antibody Screen: NEGATIVE

## 2013-08-14 MED ORDER — ASPIRIN 325 MG PO TBEC: 325.0000 mg | DELAYED_RELEASE_TABLET | Freq: Two times a day (BID) | ORAL | Status: AC

## 2013-08-14 NOTE — Progress Notes (Signed)
Patient is going to have dialysis at 6 am in the morning and then can discharge to [facility].    Sabino Niemann, MSW, [employer] [phone]

## 2013-08-14 NOTE — Progress Notes (Addendum)
   Subjective: 3 Days Post-Op Procedure(s) (LRB): RIGHT TOTAL KNEE ARTHROPLASTY (Right)   Patient reports pain as mild, pain well controlled. No events throughout the night. Ready to be discharged to SNF if does well with PT, pain stays controlled and able to work out with dialysis.  Objective:   VITALS:   Filed Vitals:   08/14/13 0518  BP: 158/68  Pulse: 64  Temp: 98.5 F (36.9 C)  Resp: 18    Neurovascular intact Dorsiflexion/Plantar flexion intact Incision: dressing C/D/I No cellulitis present Compartment soft  LABS  Recent Labs  08/12/13 0500 08/13/13 0633 08/14/13 0620  HGB 9.5* 7.5* 8.9*  HCT 28.1* 21.4* 26.8*  WBC 7.3 8.4 7.8  PLT 174 171 159     Recent Labs  08/11/13 1310 08/12/13 0500 08/13/13 0633  NA 131* 133* 128*  K 3.8 4.4 4.6  BUN 31* 46* 64*  CREATININE 3.92* 4.93* 5.85*  GLUCOSE 78 192* 82     Assessment/Plan: 3 Days Post-Op Procedure(s) (LRB): RIGHT TOTAL KNEE ARTHROPLASTY (Right) Up with therapy Discharge to SNF when ready Follow up in 2 weeks at [facility]. Follow up with OLIN,Malaysha Arlen D in 2 weeks.  Contact information:  [facility] [address] [place] [address] [phone]    Expected ABLA  H&H increased with unit of blood with dialysis from yesterday, will observe here or through dialysis.      Anastasio Auerbach Dantavious Snowball   PAC  08/14/2013, 8:47 AM

## 2013-08-14 NOTE — Clinical Social Work Psychosocial (Signed)
Clinical Social Work Department  BRIEF PSYCHOSOCIAL ASSESSMENT  Patient:Christopher [name]  Account Number: [id number]  Admit date: 08/11/13 Clinical Social Worker Sabino Niemann, MSW Date/Time: 08/13/13 2:30 PM Referred by: Physician Date Referred: 08/12/13 Referred for   SNF Placement   Other Referral:  Interview type: Patient  Other interview type: PSYCHOSOCIAL DATA  Living Status: Alone Admitted from facility:  Level of care:  Primary support name: Featherstone,Maryann  Primary support relationship to patient: Daughter Degree of support available:  Strong and vested  CURRENT CONCERNS  Current Concerns   Post-Acute Placement   Other Concerns:  SOCIAL WORK ASSESSMENT / PLAN  CSW met with pt re: PT recommendation for SNF.   Pt lives alone and receives dialysis 3x a week  CSW explained placement process and answered questions.   Pt reports [facility] in [place] as his preference    CSW completed FL2 and initiated SNF search.     Assessment/plan status: Information/Referral to [facility]  Other assessment/ plan:  Information/referral to community resources:  SNF   PTAR  PATIENT'S/FAMILY'S RESPONSE TO PLAN OF CARE:  Pt  reports he is agreeable to [facility] SNF in order to increase strength and independence with mobility prior to returning home  Pt verbalized understanding of placement process and appreciation for CSW assist.   Sabino Niemann, MSW [phone]

## 2013-08-14 NOTE — Progress Notes (Signed)
S: pain controlled.  Eating well O:BP 158/68  Pulse 64  Temp(Src) 98.5 F (36.9 C) (Oral)  Resp 18  Wt 64.8 kg (142 lb 13.7 oz)  BMI 21.73 kg/m2  SpO2 100%  Intake/Output Summary (Last 24 hours) at 08/14/13 0803 Last data filed at 08/13/13 1115  Gross per 24 hour  Intake    350 ml  Output    965 ml  Net   -615 ml   Weight change: -1.7 kg (-3 lb 12 oz) JYN:WGNFA and alert CVS:RRR Resp: Clear Abd:+ BS NTND Ext: no edema on lt  Bandage on Rt knee NEURO:CNI Ox3 no asterixis   . amLODipine  10 mg Oral q morning - 10a  . aspirin EC  325 mg Oral BID  . darbepoetin (ARANESP) injection - DIALYSIS  40 mcg Intravenous Q Thu-HD  . docusate sodium  100 mg Oral BID  . doxercalciferol  2 mcg Intravenous Q T,Th,Sa-HD  . enalapril  20 mg Oral BID  . esomeprazole  20 mg Oral QAC breakfast  . HYDROcodone-acetaminophen  1-2 tablet Oral Q4H  . influenza vac split quadrivalent PF  0.5 mL Intramuscular Tomorrow-1000  . metoprolol  100 mg Oral BID  . polyethylene glycol  17 g Oral BID   No results found. BMET    Component Value Date/Time   NA 128* 08/13/2013 0633   K 4.6 08/13/2013 0633   CL 90* 08/13/2013 0633   CO2 25 08/13/2013 0633   GLUCOSE 82 08/13/2013 0633   BUN 64* 08/13/2013 0633   CREATININE 5.85* 08/13/2013 0633   CALCIUM 8.6 08/13/2013 0633   GFRNONAA 9* 08/13/2013 0633   GFRAA 10* 08/13/2013 0633   CBC    Component Value Date/Time   WBC 7.8 08/14/2013 0620   RBC 2.95* 08/14/2013 0620   HGB 8.9* 08/14/2013 0620   HCT 26.8* 08/14/2013 0620   PLT 159 08/14/2013 0620   MCV 90.8 08/14/2013 0620   MCH 30.2 08/14/2013 0620   MCHC 33.2 08/14/2013 0620   RDW 16.5* 08/14/2013 0620     Assessment: 1. SP Rt TKR 2 ESRD 3. Sec HPTH 4. Anemia 4. HTN  Plan: 1.  HD tomorrow and recheck labs, If pt is dc'd today then please page me [phone]    Dimas Scheck T

## 2013-08-14 NOTE — Discharge Summary (Signed)
Physician Discharge Summary  Patient ID: Christopher [name] MRN: [id number] DOB/AGE: 07/03/1946 67 y.o.  Admit date: 08/11/2013 Discharge date:  tentative 08/15/2013  Procedures:  Procedure(s) (LRB): RIGHT TOTAL KNEE ARTHROPLASTY (Right)  Attending Physician:  Dr. Durene Romans   Admission Diagnoses:   Right knee OA / pain  Discharge Diagnoses:  Principal Problem:   S/P right TKA  Past Medical History  Diagnosis Date  . SLE-Sjogren overlap syndrome   . BPH (benign prostatic hypertrophy)   . Esophageal reflux   . Unspecified essential hypertension   . Osteoarthritis   . History of pancreatitis   . Bladder outlet obstruction     calculi  . History of non-ST elevation myocardial infarction (NSTEMI) 2005  . Dialysis patient   . Headache(784.0)   . Chronic kidney disease     Hemodialysis in .  TUe, Thurs, Sat  . Cancer 2008    prostate cancer.  Treated with radiation.    HPI: Christopher [name], 67 y.o. male, has a history of pain and functional disability in the right knee due to arthritis and has failed non-surgical conservative treatments for greater than 12 weeks to includeNSAID's and/or analgesics, corticosteriod injections, use of assistive devices and activity modification. Onset of symptoms was gradual, starting 3+ years ago with gradually worsening course since that time. The patient noted no past surgery on the right knee(s). Patient currently rates pain in the right knee(s) at 10 out of 10 with activity. Patient has night pain, worsening of pain with activity and weight bearing, pain that interferes with activities of daily living and pain with passive range of motion. Patient has evidence of periarticular osteophytes and joint space narrowing by imaging studies. There is no active infection. Risks, benefits and expectations were discussed with the patient. Patient understand the risks, benefits and expectations and wishes to proceed with surgery.  PCP: Kinnie Feil, MD   Discharged Condition: good  Hospital Course:  Patient underwent the above stated procedure on 08/11/2013. Patient tolerated the procedure well and brought to the recovery room in good condition and subsequently to the floor.  POD #1 BP: 145/86 ; Pulse: 50 ; Temp: 98.7 F (37.1 C) ; Resp: 18 Hemovac drain removed. IV was changed to a saline lock. Patient reports pain as mild. Appears to be doing well. No major issues with pain overnight. No events, no chest pain, shortness of breath Neurovascular intact, dorsiflexion/plantar flexion intact, incision: dressing C/D/I, no cellulitis present and compartment soft.   LABS  Basename    HGB  9.5  HCT  28.1   POD #2  BP: 159/68 ; Pulse: 57 ; Temp: 97.9 F (36.6 C) ; Resp: 18  Patient reports pain as mild, pain well controlled. No events throughout the night.  Do to decreased H&H received a unit of blood with dialysis. Neurovascular intact, dorsiflexion/plantar flexion intact, incision: dressing C/D/I, no cellulitis present and compartment soft.   LABS  Basename    HGB  7.5  HCT  21.4   POD #3  BP: 158/68 ; Pulse: 64 ; Temp: 98.5 F (36.9 C) ; Resp: 18  Patient reports pain as mild, pain well controlled. No events throughout the night.  Neurovascular intact, dorsiflexion/plantar flexion intact, incision: dressing C/D/I, no cellulitis present and compartment soft.   LABS  Basename    HGB  8.9  HCT  26.8   POD #4 Vital signs stable.  Having dialysis in the AM. Patient reports pain as mild, pain well  controlled. No events throughout the night. Ready to be discharged to SNF. Neurovascular intact, dorsiflexion/plantar flexion intact, incision: dressing C/D/I, no cellulitis present and compartment soft.   LABS  No new labs  Discharge Exam: General appearance: alert, cooperative and no distress Extremities: Homans sign is negative, no sign of DVT, no edema, redness or tenderness in the calves or thighs and no ulcers, gangrene  or trophic changes  Disposition: SNF with follow up in 2 weeks   Follow-up Information   Follow up with Shelda Pal, MD. Schedule an appointment as soon as possible for a visit in 2 weeks.   Specialty:  Orthopedic Surgery   Contact information:   [address] [place] [address] [phone]       Discharge Orders   Future Appointments Provider Department Dept Phone   09/16/2013 3:00 PM Laurey Morale, MD [facility] Main Office [place]) [phone]   Future Orders Complete By Expires   Call MD / Call 911  As directed    Comments:     If you experience chest pain or shortness of breath, CALL 911 and be transported to the hospital emergency room.  If you develope a fever above 101 F, pus (white drainage) or increased drainage or redness at the wound, or calf pain, call your surgeon's office.   Change dressing  As directed    Comments:     Maintain surgical dressing for 10-14 days, then change the dressing daily with sterile 4 x 4 inch gauze dressing and tape. Keep the area dry and clean.   Constipation Prevention  As directed    Comments:     Drink plenty of fluids.  Prune juice may be helpful.  You may use a stool softener, such as Colace (over the counter) 100 mg twice a day.  Use MiraLax (over the counter) for constipation as needed.   Diet - low sodium heart healthy  As directed    Discharge instructions  As directed    Comments:     Maintain surgical dressing for 10-14 days, then replace with gauze and tape. Keep the area dry and clean until follow up. Follow up in 2 weeks at [facility]. Call with any questions or concerns.   Increase activity slowly as tolerated  As directed    TED hose  As directed    Comments:     Use stockings (TED hose) for 2 weeks on both leg(s).  You may remove them at night for sleeping.   Weight bearing as tolerated  As directed         Medication List         amLODipine 10 MG tablet  Commonly  known as:  NORVASC  Take 10 mg by mouth every morning.     aspirin 325 MG EC tablet  Take 1 tablet (325 mg total) by mouth 2 (two) times daily.     DSS 100 MG Caps  Take 100 mg by mouth 2 (two) times daily.     enalapril 20 MG tablet  Commonly known as:  VASOTEC  Take 20 mg by mouth 2 (two) times daily.     esomeprazole 20 MG capsule  Commonly known as:  NEXIUM  Take 20 mg by mouth daily before breakfast.     HYDROcodone-acetaminophen 7.5-325 MG per tablet  Commonly known as:  NORCO  Take 1-2 tablets by mouth every 4 (four) hours as needed for pain.     metoprolol 100 MG tablet  Commonly known as:  LOPRESSOR  Take 100 mg by mouth 2 (two) times daily.     polyethylene glycol packet  Commonly known as:  MIRALAX / GLYCOLAX  Take 17 g by mouth 2 (two) times daily.     tiZANidine 4 MG tablet  Commonly known as:  ZANAFLEX  Take 1 tablet (4 mg total) by mouth every 6 (six) hours as needed (muscle spasms).         Signed: Anastasio Auerbach. Paquita Printy   PAC  08/14/2013, 9:20 AM

## 2013-08-14 NOTE — Clinical Social Work Placement (Addendum)
Clinical Social Work Department  CLINICAL SOCIAL WORK PLACEMENT NOTE  08/14/2013 Patient: Christopher [name]  Account Number: [id number] Admit date: 08/11/13 Clinical Social Worker: Sabino Niemann LCSWA Date/time: 08/12/13  11:30 AM  Clinical Social Work is seeking post-discharge placement for this patient at the following level of care: SKILLED NURSING (*CSW will update this form in Epic as items are completed)  08/13/13 Patient/family provided with [facility] Department of Clinical Social Work's list of facilities offering this level of care within the geographic area requested by the patient (or if unable, by the patient's family).  08/13/13  Patient/family informed of their freedom to choose among providers that offer the needed level of care, that participate in Medicare, Medicaid or managed care program needed by the patient, have an available bed and are willing to accept the patient.  08/13/13  Patient/family informed of [facility]' ownership interest in [facility], as well as of the fact that they are under no obligation to receive care at this facility.  PASARR submitted to EDS on 08/13/13   PASARR number received from EDS on 08/13/13  FL2 transmitted to all facilities in geographic area requested by pt/family on 08/13/13  FL2 transmitted to all facilities within larger geographic area on  Patient informed that his/her managed care company has contracts with or will negotiate with certain facilities, including the following:  Patient/family informed of bed offers received: 08/13/13  Patient chooses bed at [facility] Physician recommends and patient chooses bed at  Patient to be transferred to [facility] on 08/15/13- Jetta Lout Patient to be transferred to facility by [facility] The following physician request were entered in Epic:  Additional Comments:

## 2013-08-14 NOTE — Progress Notes (Signed)
Physical Therapy Treatment Patient Details Name: Christopher [name] MRN: [id number] DOB: [birth year] Today's Date: 08/14/2013 Time: 1043-1100 PT Time Calculation (min): 17 min  PT Assessment / Plan / Recommendation  History of Present Illness Pt admitted for R TKA on 08/11/13.  Pt also has severe L knee recurvatum and plans on having it replaced in future.  Pt receives dialysis 3x/week.   PT Comments   Patient reluctant to get into recliner this morning. With some encouragement patient agreeable to therex and to get out of bed. Patient limited by pain. Having difficulty with L leg hyperextension. Continue to recommend SNF for ongoing Physical Therapy.     Follow Up Recommendations  SNF     Does the patient have the potential to tolerate intense rehabilitation     Barriers to Discharge        Equipment Recommendations  None recommended by PT    Recommendations for Other Services    Frequency 7X/week   Progress towards PT Goals Progress towards PT goals: Progressing toward goals  Plan Current plan remains appropriate    Precautions / Restrictions Precautions Precautions: Knee Restrictions RLE Weight Bearing: Weight bearing as tolerated   Pertinent Vitals/Pain 6/10 R knee pain. RN provided medication to assist with pain control     Mobility  Bed Mobility Supine to Sit: 4: Min assist;With rails Sitting - Scoot to Edge of Bed: 4: Min assist Details for Bed Mobility Assistance: A with R LE. Patietn required A shifting weight scooting this session.  Transfers Sit to Stand: 1: +2 Total assist;From bed;With upper extremity assist;From elevated surface Sit to Stand: Patient Percentage: 70% Stand to Sit: 1: +2 Total assist;To chair/3-in-1;With armrests Stand to Sit: Patient Percentage: 70% Stand Pivot Transfers: 1: +2 Total assist;From elevated surface Stand Pivot Transfers: Patient Percentage: 30% Details for Transfer Assistance: Patient required less assistnace for stand however,  patient still not transferring well and "falls" sideways into recliner instead of taking pivotal steps. Appears to be limited by L knee.  Ambulation/Gait Ambulation/Gait Assistance: Not tested (comment)    Exercises Total Joint Exercises Quad Sets: AAROM;Right;10 reps Short Arc QuadBarbaraann [name];Right;10 reps Heel Slides: AAROM;10 reps;Right Hip ABduction/ADduction: AAROM;Right;10 reps Straight Leg Raises: AAROM;Right;10 reps   PT Diagnosis:    PT Problem List:   PT Treatment Interventions:     PT Goals (current goals can now be found in the care plan section)    Visit Information  Last PT Received On: 08/14/13 Assistance Needed: +2 History of Present Illness: Pt admitted for R TKA on 08/11/13.  Pt also has severe L knee recurvatum and plans on having it replaced in future.  Pt receives dialysis 3x/week.    Subjective Data      Cognition  Cognition Arousal/Alertness: Awake/alert Behavior During Therapy: WFL for tasks assessed/performed Overall Cognitive Status: Within Functional Limits for tasks assessed    Balance     End of Session PT - End of Session Equipment Utilized During Treatment: Gait belt Activity Tolerance: Patient limited by fatigue;Patient limited by pain Patient left: in chair;with call bell/phone within reach Nurse Communication: Mobility status   GP     Christopher [name] 08/14/2013, 11:22 AM 08/14/2013 Christopher [name] PTA [phone] pager [phone] office

## 2013-08-15 LAB — RENAL FUNCTION PANEL
CO2: 26 mEq/L (ref 19–32)
Calcium: 9 mg/dL (ref 8.4–10.5)
GFR calc Af Amer: 14 mL/min — ABNORMAL LOW (ref >90)
GFR calc non Af Amer: 12 mL/min — ABNORMAL LOW (ref >90)
Glucose, Bld: 68 mg/dL — ABNORMAL LOW (ref 70–99)
Phosphorus: 4.6 mg/dL (ref 2.3–4.6)
Potassium: 3.9 mEq/L (ref 3.5–5.1)
Sodium: 128 mEq/L — ABNORMAL LOW (ref 135–145)

## 2013-08-15 LAB — CBC
Hemoglobin: 8 g/dL — ABNORMAL LOW (ref 13.0–17.0)
MCH: 30.7 pg (ref 26.0–34.0)
MCHC: 33.8 g/dL (ref 30.0–36.0)
MCHC: 33.9 g/dL (ref 30.0–36.0)
RBC: 2.61 MIL/uL — ABNORMAL LOW (ref 4.22–5.81)
RDW: 15.9 % — ABNORMAL HIGH (ref 11.5–15.5)
WBC: 6.9 10*3/uL (ref 4.0–10.5)

## 2013-08-15 MED ORDER — DARBEPOETIN ALFA-POLYSORBATE 40 MCG/0.4ML IJ SOLN
INTRAMUSCULAR | Status: AC
  Filled 2013-08-15: qty 0.4

## 2013-08-15 NOTE — Progress Notes (Signed)
Reviewing patient valuables with the patient.  Patient denies anything missing. Patient signed receipt of valuables form and left in chart.

## 2013-08-15 NOTE — Progress Notes (Addendum)
Subjective: 4 Days Post-Op Procedure(s) (LRB): RIGHT TOTAL KNEE ARTHROPLASTY (Right) Patient reports pain as mild.  Pt seen while in dialysis this AM on rounds with Dr. Shelle Iron. Pain well controlled. Voiding without difficulty. No c/o.  Objective: Vital signs in last 24 hours: Temp:  [97.8 F (36.6 C)-99.1 F (37.3 C)] 97.8 F (36.6 C) (09/20 1610) Pulse Rate:  [61-71] 64 (09/20 0800) Resp:  [18-19] 18 (09/20 0800) BP: (103-161)/(60-82) 103/60 mmHg (09/20 0800) SpO2:  [96 %-100 %] 96 % (09/20 9604) Weight:  [66.6 kg (146 lb 13.2 oz)] 66.6 kg (146 lb 13.2 oz) (09/20 5409)  Intake/Output from previous day: 09/19 0701 - 09/20 0700 In: 360 [P.O.:360] Out: -  Intake/Output this shift:     Recent Labs  08/13/13 0633 08/14/13 0620 08/15/13 0707 08/15/13 0800  HGB 7.5* 8.9* 8.0* 8.3*    Recent Labs  08/15/13 0707 08/15/13 0800  WBC 6.2 6.9  RBC 2.61* 2.72*  HCT 23.7* 24.5*  PLT SPECIMEN CLOTTED 190    Recent Labs  08/14/13 0620 08/15/13 0707  NA 134* 128*  K 3.8 3.9  CL 92* 88*  CO2 29 26  BUN 26* 39*  CREATININE 3.33* 4.46*  GLUCOSE 69* 68*  CALCIUM 8.6 9.0   No results found for this basename: LABPT, INR,  in the last 72 hours  Neurologically intact ABD soft Neurovascular intact Sensation intact distally Intact pulses distally Dorsiflexion/Plantar flexion intact Incision: dressing C/D/I and no drainage No cellulitis present Compartment soft no calf pain or sign of DVT Swelling right knee as expected s/p TKA  Assessment/Plan: 4 Days Post-Op Procedure(s) (LRB): RIGHT TOTAL KNEE ARTHROPLASTY (Right) Advance diet Up with therapy D/C to SNF today after HD Please change dressing at drain site only and leave aquacel dressing in place prior to D/C Ice and elevation for swelling right knee Follow up with Dr Charlann Boxer as directed  Andrez Grime M. 08/15/2013, 9:01 AM

## 2013-08-15 NOTE — Progress Notes (Signed)
Gave report to Luanna Salk at [facility] in [place], [place]

## 2013-08-15 NOTE — Progress Notes (Signed)
Physical Therapy Treatment Patient Details Name: Christopher [name] MRN: [id number] DOB: 24-May-1946 Today's Date: 08/15/2013 Time: 7829-5621 PT Time Calculation (min): 11 min  PT Assessment / Plan / Recommendation  History of Present Illness Pt admitted for R TKA on 08/11/13.  Pt also has severe L knee recurvatum and plans on having it replaced in future.  Pt receives dialysis 3x/week.   PT Comments   Very limited session due to transport present to take pt to SNF however able to perform part of HEP.  Pt's right knee AAROM 15-45.  Limited to [facility] and further addressed at next venue.    Follow Up Recommendations  SNF     Does the patient have the potential to tolerate intense rehabilitation     Barriers to Discharge        Equipment Recommendations  None recommended by PT    Recommendations for Other Services    Frequency 7X/week   Progress towards PT Goals Progress towards PT goals: Progressing toward goals  Plan Current plan remains appropriate    Precautions / Restrictions Precautions Precautions: Knee Restrictions Weight Bearing Restrictions: Yes RLE Weight Bearing: Weight bearing as tolerated   Pertinent Vitals/Pain 2/10 right knee pain    Mobility  Bed Mobility Bed Mobility: Not assessed Transfers Transfers: Not assessed Ambulation/Gait Ambulation/Gait Assistance: Not tested (comment)    Exercises Total Joint Exercises Quad Sets: AAROM;Right;10 reps Heel Slides: AAROM;10 reps;Right   PT Diagnosis:    PT Problem List:   PT Treatment Interventions:     PT Goals (current goals can now be found in the care plan section) Acute Rehab PT Goals Patient Stated Goal: To go to SNF for rehab and get stronger before he goes home alone. PT Goal Formulation: With patient Time For Goal Achievement: 08/19/13 Potential to Achieve Goals: Good  Visit Information  Last PT Received On: 08/15/13 Assistance Needed: +2 History of Present Illness: Pt admitted for R TKA on  08/11/13.  Pt also has severe L knee recurvatum and plans on having it replaced in future.  Pt receives dialysis 3x/week.    Subjective Data  Subjective: "I'm leaving in a little bit but I can do some exercises with you." Patient Stated Goal: To go to SNF for rehab and get stronger before he goes home alone.   Cognition  Cognition Arousal/Alertness: Awake/alert Behavior During Therapy: WFL for tasks assessed/performed Overall Cognitive Status: Within Functional Limits for tasks assessed    Balance     End of Session PT - End of Session Activity Tolerance: Other (comment) (Limited due to transport present for d/c.) Patient left: in bed Nurse Communication: Mobility status   GP     Zaeda Mcferran 08/15/2013, 5:12 PM  Jake Shark, PT DPT [phone]

## 2013-08-15 NOTE — Procedures (Signed)
Pt seen on HD  Ap 170 Vp 200.  K 3.9, will change to 4K bath.   To be DC'd to SNF after HD.

## 2013-08-15 NOTE — Progress Notes (Signed)
Clinical Social Work (CSW) prepared D/C packet and Furniture conservator/restorer of D/C today. Patient will be transported by non-emergent EMS transport [facility]. RN will call for transport at 12:30. CSW contacted patient's daughter Angela Cox [phone] and informed her of above. Nursing is aware of above. Please reconsult if further Social Work needs arise. CSW singing off.   Jetta Lout, LCSWA Weekend CSW [phone]

## 2013-08-15 NOTE — Progress Notes (Signed)
Called [facility] for transport.  Awaiting transport to [facility].  NT getting patient ready for discharge.

## 2013-08-15 NOTE — Progress Notes (Signed)
Patient alert and oriented x4. Packet given to [employer]. IV access removed cannula intact; no redness, bleeding, drainage noted. Pt discharged via stretcher with [facility].

## 2013-09-16 ENCOUNTER — Ambulatory Visit (INDEPENDENT_AMBULATORY_CARE_PROVIDER_SITE_OTHER): Payer: Medicare Other | Admitting: Cardiology

## 2013-09-16 ENCOUNTER — Encounter: Payer: Self-pay | Admitting: Cardiology

## 2013-09-16 VITALS — BP 140/88 | HR 68 | Ht 68.0 in

## 2013-09-16 DIAGNOSIS — E785 Hyperlipidemia, unspecified: Secondary | ICD-10-CM

## 2013-09-16 DIAGNOSIS — I2510 Atherosclerotic heart disease of native coronary artery without angina pectoris: Secondary | ICD-10-CM

## 2013-09-16 DIAGNOSIS — I10 Essential (primary) hypertension: Secondary | ICD-10-CM

## 2013-09-16 MED ORDER — ATORVASTATIN CALCIUM 20 MG PO TABS: 20.0000 mg | ORAL_TABLET | Freq: Every day | ORAL | Status: AC

## 2013-09-16 NOTE — Patient Instructions (Addendum)
Start atorvastatin 20mg  daily.   Your physician recommends that you return for a FASTING lipid profile /liver profile in 2 months. This can be done at dialysis or in the [facility].   Your physician wants you to follow-up in: 1 year in the [facility]. (October 2015).  You will receive a reminder letter in the mail two months in advance. If you don't receive a letter, please call our office to schedule the follow-up appointment.

## 2013-09-16 NOTE — Progress Notes (Signed)
Patient ID: Christopher [name], male   DOB: [birth year], 67 y.o.   MRN: [id number] PCP: Dr. Austin Miles [facility])  67 yo with history of ESRD and prior NSTEMIs with multiple caths at [facility] presents for cardiology followup.  On reviewing his history, it appears that he had 4 cardiac caths between 12/05 and 10/08.  The NSTEMIs all seemed to be type II NSTEMIs in the setting of volume overload as cardiac caths showed only nonobstructive disease.  He is not taking aspirin because of hematuria thought to be related to the radiation he had for prostate cancer.  Prior to right TKA (9/14), he had a Best boy.  This showed no ischemia or infarction, but EF was 38%.  Therefore, I did an echocardiogram, which showed normal EF.  He had R TKA with no problems.  He is now in rehab.  He is starting to do some walking with his walker.  He tires easily but denies dyspnea.  No chest pain.    PMH: 1. CAD: NSTEMI 12/05 with LHC at [facility] showing 30% mLAD.  NSTEMI in 1/07 with LHC at [facility] in 1/07 with nonobstructive disease.  Echo (1/07) with EF 55%, LVH, aortic sclerosis.  NSTEMI in 5/07 with LHC at [facility] in 5/07 showing nonobstructive disease.  Myoview 8/08 at [facility] showing inferior attenuation.  LHC at [facility] in 10/08 showing nonobstructive disease.  Echo (3/12) with EF > 55%.  NSTEMIs thought to be type II in setting of volume overload.  Lexiscan Cardiolite (7/14) with EF 38%, no ischemia or infarction.  Echo (7/14) with EF 55-60%, mild MR.  2. 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  On transplant list.  3. SLE 4. Sjogrens syndrome 5. Prostate cancer s/p radiation treatment in 2009.  6. BPH 7. HTN 8. GERD 9. OA 10. Bladder outlet obstruction with multiple stones 11. H/o pancreatitis. 12. Hematuria thought to be due to scarring after radiation for prostate cancer.    SH: Quit smoking in 1980s.  Lives in [place].   FH: Mother with MI at 69, brother with MI at 69.    Current Outpatient  Prescriptions  Medication Sig Dispense Refill  . amLODipine (NORVASC) 10 MG tablet Take 10 mg by mouth every morning.       Marland Kitchen b complex-vitamin c-folic acid (NEPHRO-VITE) 0.8 MG TABS tablet Take 0.8 mg by mouth at bedtime.      . docusate sodium 100 MG CAPS Take 100 mg by mouth 2 (two) times daily.  10 capsule  0  . enalapril (VASOTEC) 20 MG tablet Take 20 mg by mouth 2 (two) times daily.       Marland Kitchen esomeprazole (NEXIUM) 20 MG capsule Take 20 mg by mouth daily before breakfast.      . HYDROcodone-acetaminophen (NORCO) 7.5-325 MG per tablet Take 1-2 tablets by mouth every 4 (four) hours as needed for pain.  120 tablet  0  . metoprolol (LOPRESSOR) 100 MG tablet Take 100 mg by mouth 2 (two) times daily.      . polyethylene glycol (MIRALAX / GLYCOLAX) packet Take 17 g by mouth 2 (two) times daily.  14 each  0  . tiZANidine (ZANAFLEX) 4 MG tablet Take 1 tablet (4 mg total) by mouth every 6 (six) hours as needed (muscle spasms).  50 tablet  0  . atorvastatin (LIPITOR) 20 MG tablet Take 1 tablet (20 mg total) by mouth daily.  90 tablet  1   No current facility-administered medications for this visit.  BP 140/88  Pulse 68  Ht 5\' 8"  (1.727 m) General: NAD Neck: JVP 8-9 cm, no thyromegaly or thyroid nodule.  Lungs: Clear to auscultation bilaterally with normal respiratory effort. CV: Nondisplaced PMI.  Heart regular S1/S2, +S4, 1/6 HSM at apex.  1+ edema to knees bilaterally.  No carotid bruit.  Normal pedal pulses.  Abdomen: Soft, nontender, no hepatosplenomegaly, no distention.  Neurologic: Alert and oriented x 3.  Psych: Normal affect. Extremities: No clubbing or cyanosis. Right hand/forearm swollen.  Assessment/Plan: 1. CAD: Nonobstructive CAD on prior cardiac caths (x 4 total, last in 10/08, all at [facility]).  He had an NSTEMI prior to each cath.  I suspect these were type II events in the setting volume overload.  No ischemia or infarction on 7/14 Cardiolite.  - He has been told not to take ASA  because of bleeding risk (hematuria).  - He should restart his statin (did not do this after last appointment).  2. Hyperlipidemia: Given nonobstructive CAD, I think it would be a good idea to restart statin (as above).  He was on atorvastatin in the past (several years) and stopped it due to expense.  Since then, it has gone generic.  I will restart him on atorvastatin 20 mg daily with lipids/LFTs in 2 months.   3. CHF: EF 55-60% on 7/14 echo.  Suspect patient has diastolic CHF in the setting of ESRD.  Today, he is volume overloaded.  He will have HD tomorrow.  He may need his dry weight lowered.   4. 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  He may need a venogram, he will show this to his nephrologist tomorrow at dialysis.   He can followup in a year in the [place] office.    Marca Ancona 09/16/2013

## 2013-11-30 ENCOUNTER — Ambulatory Visit: Payer: Self-pay | Admitting: Neurology

## 2014-01-18 ENCOUNTER — Ambulatory Visit: Payer: Self-pay | Admitting: Neurology

## 2014-02-24 ENCOUNTER — Ambulatory Visit (INDEPENDENT_AMBULATORY_CARE_PROVIDER_SITE_OTHER): Payer: Medicare Other | Admitting: Neurology

## 2014-02-24 ENCOUNTER — Encounter: Payer: Self-pay | Admitting: Neurology

## 2014-02-24 VITALS — BP 130/60 | HR 60 | Resp 14

## 2014-02-24 DIAGNOSIS — G609 Hereditary and idiopathic neuropathy, unspecified: Secondary | ICD-10-CM

## 2014-02-24 DIAGNOSIS — M6281 Muscle weakness (generalized): Secondary | ICD-10-CM

## 2014-02-24 NOTE — Progress Notes (Signed)
Radial report compliments [facility] received :  MRI of brain without contrast 01/18/2014: No acute findings. Chronic small vessel disease, somewhat advanced for age.

## 2014-02-24 NOTE — Patient Instructions (Signed)
1.  Check CK, aldolase, GM1 antibody, vitamin B12 2.  EMG of the legs 3.  Imaging of lumbar spine and brain has been requested 4.  Return to clinic in 4-6 weeks, or sooner

## 2014-02-24 NOTE — Progress Notes (Signed)
a [facility] Clinic Note - Initial Visit   Date: 02/24/2014   Christopher [name] MRN: [id number] DOB: [birth year]   Dear Dr Melrose Nakayama:  Thank you for your kind referral of Christopher [name] for consultation of bilateral leg weakness. Although his history is well known to you, please allow [place] to reiterate it for the purpose of our medical record. The patient was accompanied to the clinic by self.   History of Present Illness: Christopher [name] is a 68 y.o. right-handed African American male with history of CAD, hyperlipidemia, hypertension, SLE-Sjogren's syndrome, BPH, ESRD on dialysis (T-Th-Sat, since 2005), and prostate cancer s/p radiation (2008) presenting for evaluation of bilateral leg weakness.    Starting around 2010, he developed gradual onset of bilateral leg weakness.  There was no preceding illness, infection, or trauma.  He started noticing difficulty getting up out of a chair, standing, and walking.  If he was using a walker, it was easier to walk.  He has fallen about 5-6 times at home and would have to crawl to furniture to pull himself up.  His last fall was 3 years ago.  He currently ambulates with a wheelchair predominately, but with help he can walk with a walker.  He endorses occasional back pain.  He has numbness and tingling of the feet and hands.  Denies any weakness of his upper extremities.  He requires assistance for bathing, dressing, and transfers.  He is able to lift his legs up, but they are too weak to support his weight.  He also had a long history of right knee pain due to severe arthritis of the knee and underwent right total knee replacement in September 2014.  Prior to surgery, he was having difficulty walking, but since surgery, his weakness is worse, because he cannot even help himself out of a chair.    He has been diagnosed with SLE-Sjogren's syndrome and is not on any immunomodulatory therapy. He currently denies any dry eyes, dry  mouth, muscle twitches or cramping.  He is anuric. He endorses difficulty with swallowing, especially solid foods (steak, hamburger).    He saw Dr. Melrose Nakayama in December 2014, who ordered CK, NCS, and imaging of the lumbar spine.  NCS shows absent sural, tibial and peroneal motor responses.   Out-side paper records, electronic medical record, and images have been reviewed where available and summarized as:  MRI of lumbar spine 11-30-2013: At L5-S1 there is a mild broad-based disc bulge with more focal central disc component which abuts the bilateral intraspinal S1 nerve roots. There is moderate left foraminal stenosis. There is mild right foraminal stenosis.  Nerve conduction studies 12/02/2013: Absent sural, tibial and peroneal motor responses on the left.  Past Medical History  Diagnosis Date  . SLE-Sjogren overlap syndrome   . BPH (benign prostatic hypertrophy)   . Esophageal reflux   . Unspecified essential hypertension   . Osteoarthritis   . History of pancreatitis   . Bladder outlet obstruction     calculi  . History of non-ST elevation myocardial infarction (NSTEMI) 2005  . Dialysis patient   . Headache(784.0)   . Chronic kidney disease     Hemodialysis in [place].  TUe, Thurs, Sat  . Cancer 2008    prostate cancer.  Treated with radiation.    Past Surgical History  Procedure Laterality Date  . Dialysis fistula creation Right 2011    arm  . Cardiac catheterization  08/27/2007    couple of those  .  Renal biopsy Left   . Insertion of dialysis catheter    . Removal of a dialysis catheter    . Av fistula placement Left     x2  . Av fistula repair Left     ligation  . Total knee arthroplasty Right 08/11/2013    Procedure: RIGHT TOTAL KNEE ARTHROPLASTY;  Surgeon: Mauri Pole, MD;  Location: [facility];  Service: Orthopedics;  Laterality: Right;     Medications:  Current Outpatient Prescriptions on File Prior to Visit  Medication Sig Dispense Refill  . amLODipine (NORVASC) 10  MG tablet Take 10 mg by mouth every morning.       Marland Kitchen atorvastatin (LIPITOR) 20 MG tablet Take 1 tablet (20 mg total) by mouth daily.  90 tablet  1  . b complex-vitamin c-folic acid (NEPHRO-VITE) 0.8 MG TABS tablet Take 0.8 mg by mouth at bedtime.      . docusate sodium 100 MG CAPS Take 100 mg by mouth 2 (two) times daily.  10 capsule  0  . enalapril (VASOTEC) 20 MG tablet Take 20 mg by mouth 2 (two) times daily.       Marland Kitchen esomeprazole (NEXIUM) 20 MG capsule Take 20 mg by mouth daily before breakfast.      . HYDROcodone-acetaminophen (NORCO) 7.5-325 MG per tablet Take 1-2 tablets by mouth every 4 (four) hours as needed for pain.  120 tablet  0  . metoprolol (LOPRESSOR) 100 MG tablet Take 100 mg by mouth 2 (two) times daily.      Marland Kitchen tiZANidine (ZANAFLEX) 4 MG tablet Take 1 tablet (4 mg total) by mouth every 6 (six) hours as needed (muscle spasms).  50 tablet  0   No current facility-administered medications on file prior to visit.    Allergies: No Known Allergies  Family History: Family History  Problem Relation Age of Onset  . Heart attack Mother 82    Deceased  . Heart attack Brother 50    Deceased  . Breast cancer Sister     Deceased  . Hypertension Mother   . Healthy Son   . Healthy Daughter     Social History: History   Social History  . Marital Status: Divorced    Spouse Name: N/A    Number of Children: N/A  . Years of Education: N/A   Occupational History  . Not on file.   Social History Main Topics  . Smoking status: Former Smoker -- 1.00 packs/day for 20 years    Types: Cigarettes    Quit date: 11/27/1983  . Smokeless tobacco: Never Used     Comment: quit 1985  . Alcohol Use: No     Comment: stopped 1986, previously was drinking 1 pint/d x 20+ years  . Drug Use: No  . Sexual Activity: Not on file   Other Topics Concern  . Not on file   Social History Narrative   He lives in nursing home since September 2014.  Previously, he lived in a mobile home.   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   Highest level of education:  GED    Review of Systems:  CONSTITUTIONAL: No fevers, chills, night sweats, or weight loss.   EYES: No visual changes or eye pain ENT: No hearing changes.  No history of nose bleeds.   RESPIRATORY: No cough, wheezing and shortness of breath.   CARDIOVASCULAR: Negative for chest pain, and palpitations.   GI: Negative for abdominal  discomfort, blood in stools or black stools.  No recent change in bowel habits.   GU:  No history of incontinence.   MUSCLOSKELETAL: No history of joint pain or swelling.  No myalgias.   SKIN: Negative for lesions, rash, and itching.   HEMATOLOGY/ONCOLOGY: Negative for prolonged bleeding, bruising easily, and swollen nodes.     ENDOCRINE: Negative for cold or heat intolerance, polydipsia or goiter.   PSYCH:  No depression or anxiety symptoms.   NEURO: As Above.   Vital Signs:  BP 130/60  Pulse 60  Resp 14 General:  Well groomed gentleman, arrived in wheelchair  Neurological Exam: MENTAL STATUS including orientation to time, place, person, recent and remote memory, attention span and concentration, language, and fund of knowledge is normal.  Speech is not dysarthric.  CRANIAL NERVES: II:  No visual field defects.  Unremarkable fundi.   III-IV-VI: Pupils equal round and reactive to light.  Normal conjugate, extra-ocular eye movements in all directions of gaze.  No nystagmus.  No ptosis.   V:  Normal facial sensation.  Jaw jerk is present.   VII:  Normal facial symmetry and movements. No facial weakness.  Pathologic facial reflexes are present. VIII:  Normal hearing and vestibular function.   IX-X:  Normal palatal movement.   XI:  Normal shoulder shrug and head rotation.   XII:  Normal tongue strength and range of motion, no deviation or fasciculation.  MOTOR:  Marked atrophy of quadriceps, gastrocnemius, TA muscles bilaterally, mild atrophy of bilateral FDI  muscles.  No fasciculations or abnormal movements.  No pronator drift.  Tone is slightly reduced in the legs.    Right Upper Extremity:    Left Upper Extremity:    Deltoid  5/5   Deltoid  5/5   Biceps  5-/5   Biceps  5-/5   Triceps  4+/5   Triceps  4+/5   Wrist extensors  4+/5   Wrist extensors  5-/5   Wrist flexors  5-/5   Wrist flexors  5-/5   Finger extensors  4+/5   Finger extensors  4+/5   Finger flexors  4+/5   Finger flexors  4+/5   Dorsal interossei  4+/5   Dorsal interossei  5-/5   Abductor pollicis  4+/5   Abductor pollicis  5-/5   Tone (Ashworth scale)  0  Tone (Ashworth scale)  0   Right Lower Extremity:    Left Lower Extremity:    Hip flexors  3+/5   Hip flexors  3+/5   Hip extensors  4/5   Hip extensors  4/5   Adductors 3/5  Adductors 3/5  Abductors 5-/5  Abductor 5-/5  Knee flexors  3/5   Knee flexors  3/5   Knee extensors  3/5   Knee extensors  3/5   Dorsiflexors  5-/5   Dorsiflexors  4-/5   Plantarflexors  4/5   Plantarflexors  4-/5   Toe extensors  4/5   Toe extensors  4-/5   Toe flexors  4/5   Toe flexors  4-/5   Tone (Ashworth scale)  0  Tone (Ashworth scale)  0   MSRs:  Right  Left brachioradialis 0  brachioradialis 0  Biceps 0  Biceps 0  triceps 0  triceps 0  Patellar 0  Patellar 0  ankle jerk 0  ankle jerk 0  Hoffman no  Hoffman no  plantar response mute  plantar response mute   SENSORY:  Pin prick reduced below the knees and in the hands bilaterally.  Vibration reduced to 50% at ankles and absent at great toe.  Impaired proprioception at the great toe.  COORDINATION/GAIT: Normal finger-to- nose-finger intact.  Slowed finger and toe tapping bilaterally (L >R).  He is unable to rise from chair unassisted.  Gait not tested as patient is predominately wheelchair bound.   IMPRESSION: Mr. Yeley is a 68 year-old gentleman presenting for evaluation of bilateral leg weakness.  His examination  shows moderate weakness of hip flexion, knee extension, and knee flexion with mild-moderate weakness of the dorsiflexion and toe extension.  There is also mild weakness distally in the upper extremities.  Reflexes are absent throughout and sensation is reduced below the knees.  Based on his history and exam, I feel that there is probably more than one process contributing to leg weakness.   The prominent quadriceps weakness bilaterally can be seen in L2-4 radiculopathy, however his MRI did not show any nerve impingement at this level.  There is lumbosacral foraminal stenosis at L5-S1 which may account for weakness in these myotomes, but again does not explain proximal weakness.  Myopathy, specifically inclusion body myositis, needs to be considered especially with finger flexion weakness and mild dysphagia.  I will check CK, aldolase, GM1, and vitamin B12 for possible myopathy and neuropathy.  He has a history of SLE and Sjogren's, both which can cause atypical neuropathy.    I think there are a number of possibilities including generalized deconditioning attributing to his symptomatology and workup should help narrowed these down.     PLAN/RECOMMENDATIONS:  1.  Check CK, aldolase, GM1 antibody, vitamin B12 2.  EMG of the legs 3.  Return to clinic in 4-6 weeks, or sooner   The duration of this appointment visit was 50 minutes of face-to-face time with the patient.  Greater than 50% of this time was spent in counseling, explanation of diagnosis, planning of further management, and coordination of care.   Thank you for allowing me to participate in patient's care.  If I can answer any additional questions, I would be pleased to do so.    Sincerely,    Donika K. Posey Pronto, DO

## 2014-02-25 NOTE — Progress Notes (Signed)
Note faxed.

## 2014-03-02 ENCOUNTER — Telehealth: Payer: Self-pay | Admitting: Neurology

## 2014-03-02 NOTE — Telephone Encounter (Signed)
Labs rec'd dated 02/26/2014:  CK 330*, aldolase 4.3, vitamin B12 448, GM1 antibody is pending.  Jewelz Kobus K. Posey Pronto, DO

## 2014-03-08 LAB — GM1 ANTIBODY IGG, IGM

## 2014-03-08 LAB — ALDOLASE: Aldolase: 4.3 U/L (ref 3.3–10.3)

## 2014-03-08 LAB — VITAMIN B12: Vitamin B-12: 448 pg/mL (ref 211–946)

## 2014-03-08 LAB — CK: Total CK: 330 U/L — ABNORMAL HIGH (ref 24–204)

## 2014-04-02 ENCOUNTER — Other Ambulatory Visit: Payer: Self-pay | Admitting: *Deleted

## 2014-04-02 DIAGNOSIS — R29898 Other symptoms and signs involving the musculoskeletal system: Secondary | ICD-10-CM

## 2014-04-07 ENCOUNTER — Ambulatory Visit (INDEPENDENT_AMBULATORY_CARE_PROVIDER_SITE_OTHER): Payer: Medicare Other | Admitting: Neurology

## 2014-04-07 DIAGNOSIS — G609 Hereditary and idiopathic neuropathy, unspecified: Secondary | ICD-10-CM

## 2014-04-07 DIAGNOSIS — G1220 Motor neuron disease, unspecified: Secondary | ICD-10-CM

## 2014-04-07 DIAGNOSIS — R29898 Other symptoms and signs involving the musculoskeletal system: Secondary | ICD-10-CM

## 2014-04-07 DIAGNOSIS — G1221 Amyotrophic lateral sclerosis: Secondary | ICD-10-CM

## 2014-04-09 ENCOUNTER — Ambulatory Visit (INDEPENDENT_AMBULATORY_CARE_PROVIDER_SITE_OTHER): Payer: Medicare Other | Admitting: Neurology

## 2014-04-09 ENCOUNTER — Encounter: Payer: Self-pay | Admitting: Neurology

## 2014-04-09 VITALS — BP 124/60 | HR 74 | Resp 12

## 2014-04-09 DIAGNOSIS — R29898 Other symptoms and signs involving the musculoskeletal system: Secondary | ICD-10-CM

## 2014-04-09 DIAGNOSIS — G609 Hereditary and idiopathic neuropathy, unspecified: Secondary | ICD-10-CM

## 2014-04-09 DIAGNOSIS — G1220 Motor neuron disease, unspecified: Secondary | ICD-10-CM

## 2014-04-09 DIAGNOSIS — M3500 Sicca syndrome, unspecified: Secondary | ICD-10-CM

## 2014-04-09 DIAGNOSIS — G1221 Amyotrophic lateral sclerosis: Secondary | ICD-10-CM

## 2014-04-09 LAB — C-REACTIVE PROTEIN: CRP: 5.2 mg/dL — AB (ref <0.60)

## 2014-04-09 LAB — SEDIMENTATION RATE: SED RATE: 77 mm/h — AB (ref 0–16)

## 2014-04-09 NOTE — Progress Notes (Signed)
Note faxed.

## 2014-04-09 NOTE — Progress Notes (Signed)
Follow-up Visit   Date: 04/09/2014    Christopher [name] MRN: [id number] DOB: [birth year]   Interim History: Christopher [name] is a 68 y.o. right-handed African American male with history of CAD, hyperlipidemia, hypertension, SLE-Sjogren's syndrome, BPH, ESRD on dialysis (T-Th-Sat, since 2005), and prostate cancer s/p radiation (2008) returning to the clinic for follow-up of bilateral leg weakness.  The patient was accompanied to the clinic by self.  History of present illness: Starting around 2010, he developed gradual onset of bilateral leg weakness. There was no preceding illness, infection, or trauma. He started noticing difficulty getting up out of a chair, standing, and walking. If he was using a walker, it was easier to walk. He has fallen about 5-6 times at home and would have to crawl to furniture to pull himself up. His last fall was 3 years ago. He currently ambulates with a wheelchair predominately, but with help he can walk with a walker. He endorses occasional back pain. He has numbness and tingling of the feet and hands. He requires assistance for bathing, dressing, and transfers. He is able to lift his legs up, but they are too weak to support his weight.  He also had a long history of right knee pain due to severe arthritis of the knee and underwent right total knee replacement in September 2014. Prior to surgery, he was having difficulty walking, but since surgery, his weakness is worse, because he cannot even help himself out of a chair.   He has been diagnosed with SLE-Sjogren's syndrome and is not on any immunomodulatory therapy. He currently denies any dry eyes, dry mouth, muscle twitches or cramping. He is anuric.   He saw Dr. Melrose Nakayama in December 2014, who ordered CK, NCS, and imaging of the lumbar spine. NCS shows absent sural, tibial and peroneal motor responses.   - Follow-up 04/09/2014:  He endorses difficulty with swallowing, especially solid foods (steak, hamburger).   No problems swallowing liquids.  He has occasional painless muscle twitches in the hands.  No muscle cramps.  His weakness of the arms and legs is about the same.         Medications:  Current Outpatient Prescriptions on File Prior to Visit  Medication Sig Dispense Refill  . amLODipine (NORVASC) 10 MG tablet Take 10 mg by mouth every morning.       Marland Kitchen atorvastatin (LIPITOR) 20 MG tablet Take 1 tablet (20 mg total) by mouth daily.  90 tablet  1  . b complex-vitamin c-folic acid (NEPHRO-VITE) 0.8 MG TABS tablet Take 0.8 mg by mouth at bedtime.      . docusate sodium 100 MG CAPS Take 100 mg by mouth 2 (two) times daily.  10 capsule  0  . enalapril (VASOTEC) 20 MG tablet Take 20 mg by mouth 2 (two) times daily.       Marland Kitchen esomeprazole (NEXIUM) 20 MG capsule Take 20 mg by mouth daily before breakfast.      . gabapentin (NEURONTIN) 100 MG capsule Take 100 mg by mouth at bedtime.      Marland Kitchen HYDROcodone-acetaminophen (NORCO) 7.5-325 MG per tablet Take 1-2 tablets by mouth every 4 (four) hours as needed for pain.  120 tablet  0  . metoprolol (LOPRESSOR) 100 MG tablet Take 100 mg by mouth 2 (two) times daily.      Marland Kitchen tiZANidine (ZANAFLEX) 4 MG tablet Take 1 tablet (4 mg total) by mouth every 6 (six) hours as needed (muscle spasms).  50 tablet  0   No current facility-administered medications on file prior to visit.    Allergies: No Known Allergies   Review of Systems:  CONSTITUTIONAL: No fevers, chills, night sweats +weight loss.   EYES: No visual changes or eye pain ENT: No hearing changes.  No history of nose bleeds.   RESPIRATORY: No cough, wheezing and shortness of breath.   CARDIOVASCULAR: Negative for chest pain, and palpitations.   GI: Negative for abdominal discomfort, blood in stools or black stools.  No recent change in bowel habits.   GU:  No history of incontinence.   MUSCLOSKELETAL: No history of joint pain or swelling.  No myalgias.   SKIN: Negative for lesions, rash, and itching.     ENDOCRINE: Negative for cold or heat intolerance, polydipsia or goiter.   PSYCH:  No depression or anxiety symptoms.   NEURO: As Above.   Vital Signs:  BP 124/60  Pulse 74  Resp 12  SpO2 98%  Neurological Exam: MENTAL STATUS including orientation to time, place, person, recent and remote memory, attention span and concentration, language, and fund of knowledge is normal.  Speech is not dysarthric.  CRANIAL NERVES: Pupils equal round and reactive to light.  Normal conjugate, extra-ocular eye movements in all directions of gaze.  No ptosis. Normal facial sensation.  Face is symmetric.  There is mild weakness of the descending the muscle when asked to puff cheeks against resistance.  Palate elevates symmetrically.  Tongue is midline with mild weakness on protrusion to the right. No tongue fasciculations.  Snout this present. There is no Myeron's or palmomental reflex.  MOTOR: Marked atrophy of quadriceps, gastrocnemius, TA muscles bilaterally, mild atrophy of bilateral FDI muscles. No fasciculations or abnormal movements. No pronator drift.  Neck flexion is 4+/5 Neck extension 5/5  Right Upper Extremity:    Left Upper Extremity:    Deltoid  5/5   Deltoid  4+/5  Biceps  5-/5   Biceps  5-/5   Triceps  4+/5  Triceps  4+/5  Wrist extensors  4+/5  Wrist extensors  5-/5   Wrist flexors  5-/5   Wrist flexors  5-/5   Finger extensors  4+/5  Finger extensors  4+/5   Finger flexors  4+/5  Finger flexors  4+/5  Dorsal interossei  4+/5  Dorsal interossei  4+/5   Abductor pollicis  4+/5  Abductor pollicis  5-/5   Tone (Ashworth scale)  0   Tone (Ashworth scale)  0    Right Lower Extremity:    Left Lower Extremity:    Hip flexors  3+/5   Hip flexors  3+/5   Hip extensors  4/5   Hip extensors  4/5   Adductors  3/5   Adductors  3/5   Abductors  5-/5   Abductor  5-/5   Knee flexors  3/5   Knee flexors  3/5   Knee extensors  3/5   Knee extensors  3/5   Dorsiflexors  3/5   Dorsiflexors  3+5    Plantarflexors  3/5   Plantarflexors  3+/5   Toe extensors  3+/5  Toe extensors  3+/5   Toe flexors  3+/5  Toe flexors  3+/5   Tone (Ashworth scale)  0   Tone (Ashworth scale)  0    MSRs:  Right      Left  brachioradialis  0   brachioradialis  0   Biceps  0   Biceps  1+  triceps  0   triceps  0   Patellar  0   Patellar  0   ankle jerk  0   ankle jerk  0   Hoffman  no   Hoffman  no   plantar response  mute   plantar response  mute    SENSORY:  Pin prick reduced below the knees and in the hands bilaterally. Vibration reduced to 50% at ankles and absent at great toe. Impaired proprioception at the great toe.  COORDINATION/GAIT:  Normal finger-to- nose-finger and heel-to-shin.  Intact rapid alternating movements bilaterally.  Gait narrow based and stable.   Data: Component     Latest Ref Rng 02/24/2014  GM 1 IgM      <1:100  GM 1 IgG      <1:100  CK Total     24 - 204 U/L 330 (H)  Aldolase     3.3 - 10.3 U/L 4.3  Vitamin B-12     211 - 946 pg/mL 448   MRI of lumbar spine 11-30-2013: At L5-S1 there is a mild broad-based disc bulge with more focal central disc component which abuts the bilateral intraspinal S1 nerve roots. There is moderate left foraminal stenosis. There is mild right foraminal stenosis.   MRI brain 01/18/2014:  No acute process.  Chronic small vessel disease.  EMG 04/07/2014: 1. There is electrophysiologic evidence of a severe generalized sensorimotor polyneuropathy, axon loss and demyelinating in type, affecting the left side, conforming to a length-dependent pattern. 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 2. There is a superimposed wide spread disorder of the anterior horn cells affecting the bulbar, cervical, thoracic, and lumbosacral regions, as is seen in motor neuron disease (MND) or amyotrophic lateral sclerosis (ALS). Repeat EMG in 3-6 months may be indicated to evaluate  the degree of ongoing axon motor loss.   IMPRESSION: Mr. Wingert is a delightful 68 year-old gentleman returning to the clinic for evaluation of bilateral lower extremity weakness and dysphasia. He has progressively worsening symptoms over at least the past year. His neurological examination is most notable for low motor neuron pattern of weakness involving the cervical and lumbosacral regions. He does have pathological facial reflexes and mild facial weakness. Prior imaging of his brain did not show any abnormalities and imaging of the lumbar region shows mild disc bulge at L5-S1. His labs show mildly elevated CK of 303. He recently underwent exensitive EMG of the left side which shows active and chronic motor axonal loss changes affecting the bulbar, cervical, thoracic, and lumbosacral regions most concerning for motor neuron disease. There is also evidence of a length dependent axonal loss and demyelinating peripheral neuropathy.  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  He has both a severe peripheral neuropathy and motor neuron dysfunction which makes it difficult to delineate as to which is contributing to his symptoms predominantly.  Given that there is active changes in the tongue on EMG, my suspicion for motor neuron disease is high. His peripheral neuropathy is most likely due to underlying untreated Sjogren's disease. I will check for his autoimmune antibodies and may refer him to rheumatology for management if indicated.    I discussed that there is evidence of injury to the motor neurons and I would like to check for extrinsic causes of injury.  GM1 antibody is negative. If, however, there is little compelling  evidence from the remainder of the work-up to suggest external injury to motor neurons, the diagnosis of amyotrophic lateral sclerosis is possible.  I also explained that a repeat EMG in 3-6 months may be  necessary to look for ongoing active denervation in previously unaffected muscles.   PLAN/RECOMMENDATIONS:  1.  Check ESR, CRP, ANA, and ENA 2.  I requested that he comes with a family member his next visit in 3 weeks   The duration of this appointment visit was 45 minutes of face-to-face time with the patient.  Greater than 50% of this time was spent in counseling, explanation of diagnosis, planning of further management, and coordination of care.   Thank you for allowing me to participate in patient's care.  If I can answer any additional questions, I would be pleased to do so.    Sincerely,    Donika K. Posey Pronto, DO

## 2014-04-09 NOTE — Procedures (Signed)
[facility]  [place], [place]  [place], [place] [address] Tel: [phone] Fax:  [phone] Test Date:  04/07/2014  Patient: Christopher [name] DOB: September 28, 1946 Physician: Narda Amber, DO  Sex: Male Height: 5\' 10"  Ref Phys: Narda Amber  ID#: [id number] Temp: 32.2C Technician:    Patient Complaints: This is a 68 year-old gentleman with end-stage renal disease on hemodialysis via AV fistula of the right arm presenting with generalized weakness, worse in the legs, dysphagia, and mildly elevated CK.  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  NCV & EMG Findings: Extensive electrodiagnostic testing of the left upper and lower extremity, mid-thoracic paraspinal muscles (T7 and T11 levels), and bulbar muscles reveals: 1. Generalized sensorimotor polyneuropathy affecting the left side as evidenced by absent sensory responses in the lower extremities and prolonged as well as reduced sensory responses in the arms.  Additionally, the motor ulnar response shows reduced amplitude and decreased conduction velocity in the forearm and across the elbow. The left Ulnar (FDI) motor nerve showed prolonged distal onset latency (5.5 ms).  Median motor response is normal 2. All motor responses in the lower extremity are reduced with the peroneal motor response is absent recording at the extensor digitorum brevis and barely present at the tibialis anterior.  The tibial nerve shows no motor response. 3. F Wave studies indicate that the left ulnar F wave has prolonged latency (39.11 ms).   4. Active on chronic motor axon loss affecting C6-C8 and L2-S1 myotomes with more prominent findings in the lower extremities. 5. Active motor axon loss is present in the thoracic paraspinal muscle at T7 only. 6. Active on chronic motor axon loss changes are present in the mentalis and hyoglossus muscles. 7. Fasciculation potentials  are rare and seen in 1 of 21 muscles.  Impression: 1. There is electrophysiologic evidence of a severe generalized sensorimotor polyneuropathy, axon loss and demyelinating in type, affecting the left side, conforming to a length-dependent pattern.  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 2. There is a superimposed wide spread disorder of the anterior horn cells affecting the bulbar, cervical, thoracic, and lumbosacral regions, as is seen in motor neuron disease (MND) or amyotrophic lateral sclerosis (ALS).   Repeat EMG in 3-6 months may be indicated to evaluate the degree of ongoing axon motor loss.   ___________________________ Narda Amber, DO    Nerve Conduction Studies Anti Sensory Summary Table   Site NR Peak (ms) Norm Peak (ms) P-T Amp (V) Norm P-T Amp  Left DorsCutan Anti Sensory (Dorsum 5th MC)  Wrist NR  <3.2  >5  Left Median Anti Sensory (2nd Digit)  Wrist    4.7 <3.8 13.3 >10  Left Radial Anti Sensory (Base 1st Digit)  Wrist    3.7 <2.8 8.2 >10  Left Sup Peroneal Anti Sensory (Ant Lat Mall)  12 cm NR  <4.6  >3  Left Sural Anti Sensory (Lat Mall)  Calf NR  <4.6  >3  Left Ulnar Anti Sensory (5th Digit)  Wrist    5.0 <3.2 7.4 >5   Motor Summary Table   Site NR Onset (ms) Norm Onset (ms) O-P Amp (mV) Norm O-P Amp Site1 Site2 Delta-0 (ms) Dist (cm) Vel (m/s) Norm Vel (m/s)  Left Median Motor (Abd Poll Brev)  Wrist    3.7 <4.0 5.1 >5 Elbow  Wrist 8.2 29.0 35 >50  Elbow    11.9  4.5  Post-exercise Elbow 8.0 0.0    Post-exercise    3.9  5.3         Left Peroneal Motor (Ext Dig Brev)  Ankle NR  <6.0  >2.5 B Fib Ankle  0.0  >40  B Fib NR     Poplt B Fib  0.0  >40  Poplt NR            Left Peroneal TA Motor (Tib Ant)  Fib Head    2.8 <4.5 0.8 >3 Poplit Fib Head 4.9 10.0 20 >40  Poplit    7.7  0.6         Left Tibial Motor (Abd Hall Brev)  Ankle NR  <6.0  >4 Knee Ankle  0.0  >40    Knee NR            Left Ulnar Motor (Abd Dig Minimi)  Wrist    3.3 <3.1 4.5 >7 B Elbow Wrist 9.4 26.0 28 >50  B Elbow    12.7  1.5  A Elbow B Elbow 2.8 10.0 36 >50  A Elbow    15.5  1.9  Axilla A Elbow 3.0 0.0    Axilla    12.5  1.9         Left Ulnar (FDI) Motor (1st DI)  Wrist    5.5 <4.5 7.5 >7         F Wave Studies   NR F-Lat (ms) Lat Norm (ms) L-R F-Lat (ms)  Left Ulnar (Mrkrs) (Abd Dig Min)     39.11 <33    EMG   Side Muscle Ins Act Fibs Psw Fasc Number Recrt Dur Dur. Amp Amp. Poly Poly. Comment  Left Biceps Nml Nml Nml Nml Nml Nml Nml Nml Nml Nml Nml Nml N/A  Left PronatorTeres Nml 1+ Nml Nml 1- Mod Some 1+ Nml Nml Nml Nml N/A  Left Triceps Nml 1+ Nml Nml 1- Rapid Few 1+ Few 1+ Nml Nml N/A  Left Ext Indicis Nml Nml Nml Nml Nml Nml Nml Nml Nml Nml Nml Nml N/A  Left Cervical Parasp Low Nml Nml Nml Nml NE - - - - - - - N/A  Left FlexDigProf 4,5 Nml Nml Nml Nml 1- Mod-R Some 1+ Nml Nml Nml Nml N/A  Left ABD Dig Min Nml Nml Nml Nml 1- Mod-R Some 1+ Nml Nml Nml Nml N/A  Left Abd Poll Brev Nml Nml Nml Nml 1- Mod Few 1+ Nml Nml Nml Nml N/A  Left 1stDorInt Nml Nml 1+ Nml 2- Mod-R Some 1+ Nml Nml Nml Nml N/A  Left Mentalis Nml 1+ Nml Nml 1- Mod-R Some 1+ Nml Nml Nml Nml N/A  Left Hyoglossus Nml 1+ Nml Nml 2- Rapid Some 1+ Some 1+ Some 1+ N/A  Left RectFemoris Nml 1+ Nml Nml 1- Mod Some 1- Nml Nml Some 1+ N/A  Left VastusLat Nml 1+ Nml Nml Nml Nml Some 1- Nml Nml Many 1+ N/A  Left AntTibialis Nml 1+ Nml Nml SMU Rapid All 1+ Nml Nml All 2+ MMAV  Left GluteusMed Nml Nml 1+ Nml Nml Nml Nml Nml Nml Nml Nml Nml N/A  Left Fibularis Long Nml Nml Nml Nml 3- Rapid Some 1+ Some 1+ Nml Nml ATR  Left Lumbo Parasp Low CRD 1+ Nml Nml 2- Mod-R Some 1+ Nml Nml Nml Nml N/A  Left Flex Dig Long Nml Nml Nml Nml SMU Rapid All 1+  Nml Nml Nml Nml ATR  Left Gastroc Nml Nml Nml Nml 3- Rapid All 1+ Many 1+ Some 1+ ATR  Left T11 Parasp Nml Nml Nml Nml Nml Nml Nml Nml Nml Nml Nml Nml N/A  Left T7 Parasp Nml  2+ Nml Nml 1- Mod-V Few 1+ Nml Nml Some 1+ N/A      Waveforms:

## 2014-04-09 NOTE — Patient Instructions (Addendum)
1.  Check blood work 2.  Return to clinic in 3 weeks

## 2014-04-09 NOTE — Progress Notes (Signed)
See procedure note for EMG results.  Selim Durden K. Tarin Navarez, DO  

## 2014-04-12 LAB — ENA 9 PANEL
Centromere Ab Screen: 3.7 — ABNORMAL HIGH
DS DNA AB: 1 [IU]/mL
ENA SM Ab Ser-aCnc: <1
JO-1 ANTIBODY, IGG: NEGATIVE
RIBOSOMAL P PROTEIN AB: NEGATIVE
SM/RNP: <1
SSA (Ro) (ENA) Antibody, IgG: >8 — ABNORMAL HIGH
SSB (La) (ENA) Antibody, IgG: <1
Scleroderma (Scl-70) (ENA) Antibody, IgG: <1

## 2014-04-12 LAB — LYME AB/WESTERN BLOT REFLEX: B BURGDORFERI AB IGG+ IGM: 0.34 {ISR}

## 2014-04-12 LAB — ANA: ANA: POSITIVE — AB

## 2014-04-12 LAB — ANTI-NUCLEAR AB-TITER (ANA TITER)

## 2014-04-13 ENCOUNTER — Other Ambulatory Visit: Payer: Self-pay | Admitting: *Deleted

## 2014-04-13 DIAGNOSIS — G609 Hereditary and idiopathic neuropathy, unspecified: Secondary | ICD-10-CM

## 2014-04-15 ENCOUNTER — Encounter: Payer: Self-pay | Admitting: *Deleted

## 2014-04-26 ENCOUNTER — Encounter: Payer: Medicare Other | Admitting: Neurology

## 2014-05-11 ENCOUNTER — Telehealth: Payer: Self-pay | Admitting: *Deleted

## 2014-05-11 NOTE — Telephone Encounter (Signed)
111

## 2014-05-12 ENCOUNTER — Ambulatory Visit (INDEPENDENT_AMBULATORY_CARE_PROVIDER_SITE_OTHER): Payer: Medicare Other | Admitting: Neurology

## 2014-05-12 ENCOUNTER — Encounter: Payer: Self-pay | Admitting: Neurology

## 2014-05-12 VITALS — BP 148/84 | HR 74 | Temp 97.8°F

## 2014-05-12 DIAGNOSIS — G1220 Motor neuron disease, unspecified: Secondary | ICD-10-CM

## 2014-05-12 DIAGNOSIS — M329 Systemic lupus erythematosus, unspecified: Secondary | ICD-10-CM

## 2014-05-12 DIAGNOSIS — G63 Polyneuropathy in diseases classified elsewhere: Secondary | ICD-10-CM

## 2014-05-12 DIAGNOSIS — M359 Systemic involvement of connective tissue, unspecified: Secondary | ICD-10-CM

## 2014-05-12 DIAGNOSIS — M3500 Sicca syndrome, unspecified: Secondary | ICD-10-CM

## 2014-05-12 DIAGNOSIS — G1221 Amyotrophic lateral sclerosis: Secondary | ICD-10-CM

## 2014-05-12 NOTE — Progress Notes (Signed)
Note faxed.

## 2014-05-12 NOTE — Patient Instructions (Signed)
I will see you back in August after you have been evaluated by Rheumatology.

## 2014-05-12 NOTE — Progress Notes (Signed)
Follow-up Visit   Date: 05/12/2014    Christopher [name] MRN: [id number] DOB: 11-22-1946   Interim History: Christopher [name] is a 68 y.o. right-handed African American male with history of CAD, hyperlipidemia, hypertension, SLE-Sjogren's syndrome, BPH, ESRD on dialysis (T-Th-Sat, since 2005), and prostate cancer s/p radiation (2008) returning to the clinic for follow-up of bilateral leg weakness.  The patient was accompanied to the clinic by his daughter, Christopher [name].  History of present illness: Starting around 2010, he developed gradual onset of bilateral leg weakness. There was [name] preceding illness, infection, or trauma. He started noticing difficulty getting up out of a chair, standing, and walking. If he was using a walker, it was easier to walk. He has fallen about 5-6 times at home and would have to crawl to furniture to pull himself up. His last fall was 3 years ago. He is wheelchair bound. He endorses occasional back pain. He has numbness and tingling of the feet and hands. He requires assistance for bathing, dressing, and transfers. He is able to lift his legs up, but they are too weak to support his weight.  He also had a long history of right knee pain due to severe arthritis of the knee and underwent right total knee replacement in September 2014. Prior to surgery, he was having difficulty walking, but since surgery, his weakness is worse, because he cannot even help himself out of a chair.   He has been diagnosed with SLE-Sjogren's syndrome and is not on any immunomodulatory therapy. He currently denies any dry eyes, dry mouth, muscle twitches or cramping. He is anuric. At one point, he was being seen at [facility] for renal transplant, however due to his undiagnosed weakness, he was deemed not to be a candidate.  He saw Dr. Melrose Nakayama, Neurologist at [facility], in December 2014, who ordered CK, NCS, and imaging of the lumbar spine. NCS shows absent sural, tibial and  peroneal motor responses. For further evaluation, patient was referred to me.  - Follow-up 04/09/2014:   He endorses difficulty with swallowing, especially solid foods (steak, hamburger).   He has occasional painless muscle twitches in the hands.    - Follow-up 05/12/2014:  He feels that the weakness of his arms and legs is stable.  He has [name] had any falls because he is wheelchair bound.  He continues to have problems swallowing solids with rare choking spells, [name] problems with liquids.  He denies any dyspnea and sleeps on one pillow.  [name] muscle cramps or twitches.      Medications:  Current Outpatient Prescriptions on File Prior to Visit  Medication Sig Dispense Refill  . amLODipine (NORVASC) 10 MG tablet Take 10 mg by mouth every morning.       Marland Kitchen atorvastatin (LIPITOR) 20 MG tablet Take 1 tablet (20 mg total) by mouth daily.  90 tablet  1  . b complex-vitamin c-folic acid (NEPHRO-VITE) 0.8 MG TABS tablet Take 0.8 mg by mouth at bedtime.      . docusate sodium 100 MG CAPS Take 100 mg by mouth 2 (two) times daily.  10 capsule  0  . enalapril (VASOTEC) 20 MG tablet Take 20 mg by mouth 2 (two) times daily.       Marland Kitchen esomeprazole (NEXIUM) 20 MG capsule Take 20 mg by mouth daily before breakfast.      . gabapentin (NEURONTIN) 100 MG capsule Take 100 mg by mouth at bedtime.      Marland Kitchen HYDROcodone-acetaminophen ([place])  7.5-325 MG per tablet Take 1-2 tablets by mouth every 4 (four) hours as needed for pain.  120 tablet  0  . metoprolol (LOPRESSOR) 100 MG tablet Take 100 mg by mouth 2 (two) times daily.      Marland Kitchen tiZANidine (ZANAFLEX) 4 MG tablet Take 1 tablet (4 mg total) by mouth every 6 (six) hours as needed (muscle spasms).  50 tablet  0   [name] current facility-administered medications on file prior to visit.    Allergies: [name] Known Allergies   Review of Systems:  CONSTITUTIONAL: [name] fevers, chills, night sweats +weight loss.   EYES: [name] visual changes or eye pain ENT: [name] hearing changes.  [name] history of  nose bleeds.   RESPIRATORY: [name] cough, wheezing and shortness of breath.   CARDIOVASCULAR: Negative for chest pain, and palpitations.   GI: Negative for abdominal discomfort, blood in stools or black stools.  [name] recent change in bowel habits.   GU:  [name] history of incontinence.   MUSCLOSKELETAL: [name] history of joint pain or swelling.  [name] myalgias.   SKIN: Negative for lesions, rash, and itching.   ENDOCRINE: Negative for cold or heat intolerance, polydipsia or goiter.   PSYCH:  [name] depression or anxiety symptoms.   NEURO: As Above.   Vital Signs:  BP 148/84  Pulse 74  Temp(Src) 97.8 F (36.6 C)  SpO2 99%  Neurological Exam: MENTAL STATUS including orientation to time, place, person, recent and remote memory, attention span and concentration, language, and fund of knowledge is normal.  Speech is not dysarthric.  CRANIAL NERVES: Pupils equal round and reactive to light.  Normal conjugate, extra-ocular eye movements in all directions of gaze.  [name] ptosis.  Face is symmetric.  There is mild weakness of the descending the muscle when asked to puff cheeks against resistance.  Palate elevates symmetrically.  Tongue is midline with mild weakness on protrusion to the right. [name] tongue fasciculations.  Snout this present (L >R). There is [name] Myeron's or palmomental reflex.  MOTOR: Marked atrophy of quadriceps, gastrocnemius, TA muscles bilaterally, mild atrophy of bilateral FDI muscles. [name] fasciculations or abnormal movements. [name] pronator drift.  Neck flexion is 4+/5 Neck extension 5/5  Right Upper Extremity:    Left Upper Extremity:    Deltoid  5/5   Deltoid  4+/5  Biceps  5-/5   Biceps  5-/5   Triceps  4/5  Triceps  4+/5  Wrist extensors  4/5  Wrist extensors  5-/5   Wrist flexors  4+/5   Wrist flexors  5-/5   Finger extensors  4/5  Finger extensors  4+/5   Finger flexors  4/5  Finger flexors  4+/5  Dorsal interossei  4/5  Dorsal interossei  4+/5   Abductor pollicis  4/5  Abductor pollicis   5-/5   Tone (Ashworth scale)  0   Tone (Ashworth scale)  0    Right Lower Extremity:    Left Lower Extremity:    Hip flexors  3+/5   Hip flexors  3+/5   Hip extensors  4/5   Hip extensors  4/5   Adductors  3/5   Adductors  3/5   Abductors  5-/5   Abductor  5-/5   Knee flexors  4/5   Knee flexors  4-/5   Knee extensors  3/5   Knee extensors  3/5   Dorsiflexors  3/5   Dorsiflexors  3+5   Plantarflexors  3/5   Plantarflexors  3+/5   Toe extensors  3+/5  Toe extensors  3+/5   Toe flexors  3+/5  Toe flexors  3+/5   Tone (Ashworth scale)  0   Tone (Ashworth scale)  0    MSRs:  Right      Left  brachioradialis  0   brachioradialis  0   Biceps  0   Biceps  0  triceps  0   triceps  0   Patellar  0   Patellar  0   ankle jerk  0   ankle jerk  0   Hoffman  [name]   Hoffman  [name]   plantar response  mute   plantar response  mute    SENSORY:  Pin prick reduced below the knees and in the hands bilaterally. Vibration reduced to 50% at ankles and absent at great toe. Impaired proprioception at the great toe.  COORDINATION/GAIT:  Normal finger-to- nose-finger and heel-to-shin.  Intact rapid alternating movements bilaterally.  Gait narrow based and stable.   Data: Component     Latest Ref Rng 02/24/2014  GM 1 IgM      <1:100  GM 1 IgG      <1:100  CK Total     24 - 204 U/L 330 (H)  Aldolase     3.3 - 10.3 U/L 4.3  Vitamin B-12     211 - 946 pg/mL 448   Component     Latest Ref Rng 04/09/2014  ds DNA Ab      1  Ribosomal P Protein Ab     <1.0 NEG AI <1.0 NEG  SSA (Ro) (ENA) Antibody, IgG     <1.0 NEG AI >8.0 POS (H)  SSB (La) (ENA) Antibody, IgG     <1.0 NEG AI <1.0 NEG  Centromere Ab Screen     <1.0 NEG AI 3.7 POS (H)  ENA SM Ab Ser-aCnc     <1.0 NEG AI <1.0 NEG  SM/RNP     <1.0 NEG AI <1.0 NEG  Scleroderma (Scl-70) (ENA) Antibody, IgG     <1.0 NEG AI <1.0 NEG  Jo-1 Antibody, IgG     <1.0 NEG AI <1.0 NEG  ANA Titer 1     <1:40   1:40 (H)  ANA Pattern 1      SPECKLED (A)  ANA      NEGATIVE POS (A)  Sed Rate     0 - 16 mm/hr 77 (H)  CRP     <0.60 mg/dL 5.2 (H)  B burgdorferi Ab IgG+IgM      0.34    MRI of lumbar spine 11-30-2013: At L5-S1 there is a mild broad-based disc bulge with more focal central disc component which abuts the bilateral intraspinal S1 nerve roots. There is moderate left foraminal stenosis. There is mild right foraminal stenosis.  MRI brain 01/18/2014:  [name] acute process.  Chronic small vessel disease.  EMG 04/07/2014: 1. There is electrophysiologic evidence of a severe generalized sensorimotor polyneuropathy, axon loss and demyelinating in type, affecting the left side, conforming to a length-dependent pattern. 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 2. There is a superimposed wide spread disorder of the anterior horn cells affecting the bulbar, cervical, thoracic, and lumbosacral regions, as is seen in motor neuron disease (MND) or amyotrophic lateral sclerosis (ALS). Repeat EMG in 3-6 months may be indicated to evaluate the degree of ongoing axon motor loss.   IMPRESSION: Mr.  Nyce is a delightful 68 year-old gentleman returning to the clinic for evaluation of bilateral lower extremity weakness and dysphasia. He has painless progressive weakness of the arms and legs since 2010, and most notably over the past year. His neurological examination shows lower motor neuron pattern of weakness involving the cervical and lumbosacral regions. He does have pathological facial reflexes and mild facial weakness. Prior imaging of his brain did not show any abnormalities and imaging of the lumbar region shows mild disc bulge at L5-S1. His labs show mildly elevated CK of 303.   In May 2015, I performed exensitive EMG of the left side which shows active and chronic motor axonal loss changes affecting the bulbar, cervical, thoracic, and lumbosacral regions most concerning for  motor neuron disease. There is also evidence of a length dependent axonal loss and demyelinating peripheral neuropathy.  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  There is [name] evidence of myopathy or myositis.  He has both a severe peripheral neuropathy and motor neuron dysfunction which makes it difficult to delineate as to which is contributing to his symptoms predominantly.  Given that there is active changes in the tongue on EMG, my suspicion for motor neuron disease is high. His peripheral neuropathy is most likely due to underlying Sjogren's disease.  Recent lab testing shows elevated ESR (77), CRP (5.2), positive ANA (1:40), SSA, and centromere antibody.   Although my clinical suspicion for SLE-Sjogren's disease causing motor neuron disease is low, there have been case reports in which this was the case.  Therefore, I would like for him to be seen by Rheumatology to be sure we are not missing something treatable.  If clinically indicated, I have [name] objection to a short course of steroids to see if there is any improvement.  Other labs looking for extrinsic causes of nerve injury, including GM1 antibody is negative.  I had an extensive discussion with the patient and his daughter regarding the plan going forward. I have discussed the diagnosis of ALS, should [name] other cause for his motor neuron disease be identified.    PLAN/RECOMMENDATIONS:  1.  Patient scheduled to see Dr. Trudie Reed on 06/16/2014 at 1pm 2.  Modified barium swallow, if dysphagia worsens 3.  Return to clinic in August, or sooner as needed   The duration of this appointment visit was 45 minutes of face-to-face time with the patient.  Greater than 50% of this time was spent in counseling, explanation of diagnosis, planning of further management, and coordination of care.   Thank you for allowing me to participate in patient's care.  If I can answer  any additional questions, I would be pleased to do so.    Sincerely,    Donika K. Posey Pronto, DO

## 2014-05-14 ENCOUNTER — Emergency Department: Payer: Self-pay | Admitting: Emergency Medicine

## 2014-05-14 LAB — LIPASE, BLOOD: Lipase: 514 U/L — ABNORMAL HIGH (ref 73–393)

## 2014-05-14 LAB — COMPREHENSIVE METABOLIC PANEL
ALT: 33 U/L (ref 12–78)
ANION GAP: 6 — AB (ref 7–16)
AST: 36 U/L (ref 15–37)
Albumin: 2.9 g/dL — ABNORMAL LOW (ref 3.4–5.0)
Alkaline Phosphatase: 103 U/L
BUN: 29 mg/dL — AB (ref 7–18)
Bilirubin,Total: 0.3 mg/dL (ref 0.2–1.0)
CALCIUM: 8.5 mg/dL (ref 8.5–10.1)
CHLORIDE: 93 mmol/L — AB (ref 98–107)
CREATININE: 3.96 mg/dL — AB (ref 0.60–1.30)
Co2: 32 mmol/L (ref 21–32)
EGFR (African American): 17 — ABNORMAL LOW
EGFR (Non-African Amer.): 14 — ABNORMAL LOW
GLUCOSE: 97 mg/dL (ref 65–99)
Osmolality: 268 (ref 275–301)
Potassium: 4.3 mmol/L (ref 3.5–5.1)
SODIUM: 131 mmol/L — AB (ref 136–145)
Total Protein: 8.3 g/dL — ABNORMAL HIGH (ref 6.4–8.2)

## 2014-05-14 LAB — MAGNESIUM: Magnesium: 1.5 mg/dL — ABNORMAL LOW

## 2014-05-14 LAB — CBC
HCT: 35.8 % — ABNORMAL LOW (ref 40.0–52.0)
HGB: 11.5 g/dL — AB (ref 13.0–18.0)
MCH: 30.8 pg (ref 26.0–34.0)
MCHC: 32.2 g/dL (ref 32.0–36.0)
MCV: 96 fL (ref 80–100)
Platelet: 225 10*3/uL (ref 150–440)
RBC: 3.75 10*6/uL — ABNORMAL LOW (ref 4.40–5.90)
RDW: 13.9 % (ref 11.5–14.5)
WBC: 7.3 10*3/uL (ref 3.8–10.6)

## 2014-05-14 LAB — TROPONIN I: TROPONIN-I: 0.06 ng/mL — AB

## 2014-06-23 ENCOUNTER — Telehealth: Payer: Self-pay | Admitting: Neurology

## 2014-06-23 NOTE — Telephone Encounter (Signed)
DR Lenna Gilford would like to talk to you about pt please call [phone] thank you

## 2014-06-24 NOTE — Telephone Encounter (Signed)
Return call to Dr. Trudie Reed. Discussed that muscle biopsy would not be unreasonable given his low motor neuron pattern of presentation and known history of lupus. As ALS is a diagnosis of exclusion, I do not disagree with pursuing a muscle biopsy.  Christopher Barb K. Posey Pronto, DO

## 2014-07-02 ENCOUNTER — Other Ambulatory Visit: Payer: PRIVATE HEALTH INSURANCE

## 2014-07-14 ENCOUNTER — Encounter: Payer: Self-pay | Admitting: Neurology

## 2014-07-14 ENCOUNTER — Ambulatory Visit (INDEPENDENT_AMBULATORY_CARE_PROVIDER_SITE_OTHER): Payer: Medicare Other | Admitting: Neurology

## 2014-07-14 VITALS — BP 130/80 | HR 76 | Temp 98.2°F | Resp 12

## 2014-07-14 DIAGNOSIS — M329 Systemic lupus erythematosus, unspecified: Secondary | ICD-10-CM

## 2014-07-14 DIAGNOSIS — M3500 Sicca syndrome, unspecified: Secondary | ICD-10-CM

## 2014-07-14 DIAGNOSIS — G1220 Motor neuron disease, unspecified: Secondary | ICD-10-CM

## 2014-07-14 DIAGNOSIS — G1221 Amyotrophic lateral sclerosis: Secondary | ICD-10-CM

## 2014-07-14 DIAGNOSIS — M6281 Muscle weakness (generalized): Secondary | ICD-10-CM

## 2014-07-14 NOTE — Progress Notes (Signed)
Note faxed.

## 2014-07-14 NOTE — Progress Notes (Signed)
Follow-up Visit   Date: 07/14/2014    Christopher [name] MRN: [id number] DOB: 10-07-1946   Interim History: Christopher [name] is a 68 y.o. right-handed African American male with history of CAD, hyperlipidemia, hypertension, SLE-Sjogren's syndrome, BPH, ESRD on dialysis (T-Th-Sat, since 2005), and prostate cancer s/p radiation (2008) returning to the clinic for follow-up of bilateral leg weakness.  The patient was accompanied to the clinic by his daughter, [name].  History of present illness: Starting around 2010, he developed gradual onset of bilateral leg weakness. There was no preceding illness, infection, or trauma. He started noticing difficulty getting up out of a chair, standing, and walking. If he was using a walker, it was easier to walk. He has fallen about 5-6 times at home and would have to crawl to furniture to pull himself up. His last fall was 3 years ago. He is wheelchair bound. He endorses occasional back pain. He has numbness and tingling of the feet and hands. He requires assistance for bathing, dressing, and transfers. He is able to lift his legs up, but they are too weak to support his weight.  He also had a long history of right knee pain due to severe arthritis of the knee and underwent right total knee replacement in September 2014. Prior to surgery, he was having difficulty walking, but since surgery, his weakness is worse, because he cannot even help himself out of a chair.   He has been diagnosed with SLE-Sjogren's syndrome and is not on any immunomodulatory therapy. He currently denies any dry eyes, dry mouth, muscle twitches or cramping. He is anuric. At one point, he was being seen at [facility] for renal transplant, however due to his undiagnosed weakness, he was deemed not to be a candidate.  He saw Dr. Melrose Nakayama, Neurologist at [facility], in December 2014, who ordered CK, NCS, and imaging of the lumbar spine. NCS shows absent sural, tibial and  peroneal motor responses. For further evaluation, patient was referred to me.  - Follow-up 04/09/2014:   He endorses difficulty with swallowing, especially solid foods.   He has occasional painless muscle twitches in the hands.    - Follow-up 05/12/2014:  He has no had any falls because he is wheelchair bound. No new complaints.  UPDATE 07/14/2014:  He saw Dr. Trudie Reed (rheumatology) who evaluated the patient and recommends muscle biopsy to be sure we are not missing a myopathy, which I think is very reasonable.  He unfortunately missed his follow-up appointment this week because they picked up the wrong patient from his facility, but has been rescheduled.  He restarted therapy and was able to stand with assistance for at least a minute. He continues to have mild difficulty with swallowing solids (steak, beef patty, etc).  No worsening of symptoms.  Denies any shortness of breath.    Medications:  Current Outpatient Prescriptions on File Prior to Visit  Medication Sig Dispense Refill  . amLODipine (NORVASC) 10 MG tablet Take 10 mg by mouth every morning.       Marland Kitchen atorvastatin (LIPITOR) 20 MG tablet Take 1 tablet (20 mg total) by mouth daily.  90 tablet  1  . b complex-vitamin c-folic acid (NEPHRO-VITE) 0.8 MG TABS tablet Take 0.8 mg by mouth at bedtime.      . docusate sodium 100 MG CAPS Take 100 mg by mouth 2 (two) times daily.  10 capsule  0  . enalapril (VASOTEC) 20 MG tablet Take 20 mg by mouth 2 (  two) times daily.       Marland Kitchen esomeprazole (NEXIUM) 20 MG capsule Take 20 mg by mouth daily before breakfast.      . gabapentin (NEURONTIN) 100 MG capsule Take 100 mg by mouth at bedtime.      Marland Kitchen HYDROcodone-acetaminophen (NORCO) 7.5-325 MG per tablet Take 1-2 tablets by mouth every 4 (four) hours as needed for pain.  120 tablet  0  . metoprolol (LOPRESSOR) 100 MG tablet Take 100 mg by mouth 2 (two) times daily.      . SENSIPAR 30 MG tablet       . tiZANidine (ZANAFLEX) 4 MG tablet Take 1 tablet (4 mg total)  by mouth every 6 (six) hours as needed (muscle spasms).  50 tablet  0   No current facility-administered medications on file prior to visit.    Allergies: No Known Allergies   Review of Systems:  CONSTITUTIONAL: No fevers, chills, night sweats, no weight loss.   EYES: No visual changes or eye pain ENT: No hearing changes.  No history of nose bleeds.   RESPIRATORY: No cough, wheezing and shortness of breath.   CARDIOVASCULAR: Negative for chest pain, and palpitations.   GI: Negative for abdominal discomfort, blood in stools or black stools.  No recent change in bowel habits.   GU:  No history of incontinence.   MUSCLOSKELETAL: No history of joint pain or swelling.  No myalgias.   SKIN: Negative for lesions, rash, and itching.   ENDOCRINE: Negative for cold or heat intolerance, polydipsia or goiter.   PSYCH:  No depression or anxiety symptoms.   NEURO: As Above.   Vital Signs:  BP 130/80  Pulse 76  Temp(Src) 98.2 F (36.8 C) (Oral)  Resp 12  Neurological Exam: MENTAL STATUS including orientation to time, place, person is intact. Speech is not dysarthric.  CRANIAL NERVES: Pupils equal round and reactive to light.  Normal conjugate, extra-ocular eye movements in all directions of gaze.  No ptosis.  Face is symmetric.  There is mild weakness of the descending the muscle when asked to puff cheeks against resistance.  Palate elevates symmetrically.  Tongue is midline with mild weakness on protrusion to the right. No tongue fasciculations.  Snout this present (L >R). There is no Myeron's or palmomental reflex.  MOTOR: Marked atrophy of quadriceps, gastrocnemius, TA muscles bilaterally, mild atrophy of bilateral FDI muscles. No fasciculations or abnormal movements. No pronator drift.  Neck flexion is 4+/5 Neck extension 5/5  Right Upper Extremity:    Left Upper Extremity:    Deltoid  5/5   Deltoid  4+/5  Biceps  5-/5   Biceps  5-/5   Triceps  5-/5  Triceps  4+/5  Wrist extensors   4/5  Wrist extensors  5-/5   Wrist flexors  4+/5   Wrist flexors  5-/5   Finger extensors  4/5  Finger extensors  4+/5   Finger flexors  4/5  Finger flexors  4+/5  Dorsal interossei  4/5  Dorsal interossei  4+/5   Abductor pollicis  4/5  Abductor pollicis  5-/5   Tone (Ashworth scale)  0   Tone (Ashworth scale)  0    Right Lower Extremity:    Left Lower Extremity:    Hip flexors  4/5  Hip flexors  4-5  Hip extensors  4/5   Hip extensors  4/5   Adductors  3/5   Adductors  3/5   Abductors  5-/5   Abductor  5-/5  Knee flexors  4/5   Knee flexors  4-/5   Knee extensors  3/5   Knee extensors  3/5   Dorsiflexors  4/   Dorsiflexors  3+5   Plantarflexors  3/5   Plantarflexors  3+/5   Toe extensors  3+/5  Toe extensors  3+/5   Toe flexors  3+/5  Toe flexors  3+/5   Tone (Ashworth scale)  0   Tone (Ashworth scale)  0    MSRs:  Reflexes are absent throughout upper and lower extremities.    SENSORY:  Pin prick reduced below the knees and in the hands bilaterally. Vibration reduced to 50% at ankles and absent at great toe. Impaired proprioception at the great toe.  COORDINATION/GAIT:  Not tested, patient wheelchair bound.  Slowed finger tapping due to weakness.  Data: Component     Latest Ref Rng 02/24/2014  GM 1 IgM      <1:100  GM 1 IgG      <1:100  CK Total     24 - 204 U/L 330 (H)  Aldolase     3.3 - 10.3 U/L 4.3  Vitamin B-12     211 - 946 pg/mL 448   Component     Latest Ref Rng 04/09/2014  ds DNA Ab      1  Ribosomal P Protein Ab     <1.0 NEG AI <1.0 NEG  SSA (Ro) (ENA) Antibody, IgG     <1.0 NEG AI >8.0 POS (H)  SSB (La) (ENA) Antibody, IgG     <1.0 NEG AI <1.0 NEG  Centromere Ab Screen     <1.0 NEG AI 3.7 POS (H)  ENA SM Ab Ser-aCnc     <1.0 NEG AI <1.0 NEG  SM/RNP     <1.0 NEG AI <1.0 NEG  Scleroderma (Scl-70) (ENA) Antibody, IgG     <1.0 NEG AI <1.0 NEG  Jo-1 Antibody, IgG     <1.0 NEG AI <1.0 NEG  ANA Titer 1     <1:40   1:40 (H)  ANA Pattern 1       SPECKLED (A)  ANA     NEGATIVE POS (A)  Sed Rate     0 - 16 mm/hr 77 (H)  CRP     <0.60 mg/dL 5.2 (H)  B burgdorferi Ab IgG+IgM      0.34    MRI of lumbar spine 11-30-2013: At L5-S1 there is a mild broad-based disc bulge with more focal central disc component which abuts the bilateral intraspinal S1 nerve roots. There is moderate left foraminal stenosis. There is mild right foraminal stenosis.  MRI brain 01/18/2014:  No acute process.  Chronic small vessel disease.  EMG 04/07/2014: 1. There is electrophysiologic evidence of a severe generalized sensorimotor polyneuropathy, axon loss and demyelinating in type, affecting the left side, conforming to a length-dependent pattern. 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 2. There is a superimposed wide spread disorder of the anterior horn cells affecting the bulbar, cervical, thoracic, and lumbosacral regions, as is seen in motor neuron disease (MND) or amyotrophic lateral sclerosis (ALS). Repeat EMG in 3-6 months may be indicated to evaluate the degree of ongoing axon motor loss.   IMPRESSION: Mr. Valade is a delightful 68 year-old gentleman returning to the clinic for evaluation of bilateral lower extremity weakness. He has painless progressive weakness of the arms and legs since 2010,  worse since 2014. His neurological examination shows lower motor neuron pattern of weakness involving the cervical and lumbosacral regions, although there is mild improved in proximal strength at today's visit since start PT. He does have pathological facial reflexes and mild facial weakness. Prior imaging of his brain did not show any abnormalities and imaging of the lumbar region shows mild disc bulge at L5-S1. His labs show mildly elevated CK of 303.   In May 2015, I performed exensitive EMG of the left side which shows active and chronic motor axonal loss changes affecting the  bulbar, cervical, thoracic, and lumbosacral regions most concerning for motor neuron disease. There is also evidence of a length dependent axonal loss and demyelinating peripheral neuropathy.  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  There is no evidence of myopathy or myositis.  He has both a severe peripheral neuropathy and motor neuron dysfunction which makes it difficult to delineate as to which is contributing to his symptoms predominantly.  Given that there is active changes in the tongue on EMG, my suspicion for motor neuron disease is high. His peripheral neuropathy is most likely due to underlying Sjogren's disease.  I appreciate Dr. Trudie Reed assistance in the management of this patient and agree with her that a muscle biopsy would help differentiate if there is an underlying myopathy, even though EMG showed predominate neurogenic changes.  Given that ALS is a diagnosis of exclusion with no curative treatment, muscle biopsy would also be helpful to look for potentially treatable causes of his weakness.    If clinically indicated and there is evidence of myositis on biopsy, I have no objection to a short course of steroids to see if there is any improvement.    PLAN/RECOMMENDATIONS:  1.  Continue occupational and physical therapy  2.  Agree with Dr. Trudie Reed re: muscle biopsy - rectus femoris/vastus lateralis both have active changes  3.  Modified barium swallow, if dysphagia worsens 4.  Return to clinic 63-months    The duration of this appointment visit was 30 minutes of face-to-face time with the patient.  Greater than 50% of this time was spent in counseling, explanation of diagnosis, planning of further management, and coordination of care.   Thank you for allowing me to participate in patient's care.  If I can answer any additional questions, I would be pleased to do so.    Sincerely,    Wilber Fini K.  Posey Pronto, DO

## 2014-07-14 NOTE — Patient Instructions (Signed)
Continue therapy as you are Return to clinic in 33-months, or sooner as needed.

## 2014-07-23 ENCOUNTER — Ambulatory Visit (INDEPENDENT_AMBULATORY_CARE_PROVIDER_SITE_OTHER): Payer: Medicare Other | Admitting: General Surgery

## 2014-07-23 ENCOUNTER — Telehealth (INDEPENDENT_AMBULATORY_CARE_PROVIDER_SITE_OTHER): Payer: Self-pay

## 2014-07-23 VITALS — BP 140/90 | HR 89 | Temp 97.9°F | Ht 70.0 in | Wt 160.0 lb

## 2014-07-23 DIAGNOSIS — R29898 Other symptoms and signs involving the musculoskeletal system: Secondary | ICD-10-CM

## 2014-07-23 NOTE — Telephone Encounter (Signed)
i can do this under MAC, but not straight local.

## 2014-07-23 NOTE — Patient Instructions (Signed)
We will schedule muscle biopsy next week.

## 2014-07-23 NOTE — Telephone Encounter (Signed)
Calling about pt scheduled for muscle bx on 9/2 b/c they are questioning the anesthesia that is ordered for this pt as general when normally it's under local. Please advise. Edwena Felty said if you change to local just have our schedulers correct this that you would not have to call her back.

## 2014-07-23 NOTE — Progress Notes (Signed)
Chief Complaint  Patient presents with  . Rt thigh bx    Referring MD: Dr. Gavin Pound  HISTORY:  Pt is a 68 yo M referred by Dr. Trudie Reed for right muscle biopsy.  He started having weakness over 1 year ago.  This has been progressive.  He has had multiple falls and been unable to lift himself up off the ground.  This got worse after right knee replacement.  He has a history of sjogren syndrome, but Dr. Trudie Reed is concerned that he may have lupus.  Muscle biopsy is requested for assistance with diagnosis.    Past Medical History  Diagnosis Date  . SLE-Sjogren overlap syndrome   . BPH (benign prostatic hypertrophy)   . Esophageal reflux   . Unspecified essential hypertension   . Osteoarthritis   . History of pancreatitis   . Bladder outlet obstruction     calculi  . History of non-ST elevation myocardial infarction (NSTEMI) 2005  . Dialysis patient   . Headache(784.0)   . Chronic kidney disease     Hemodialysis in [place].  TUe, Thurs, Sat  . Cancer 2008    prostate cancer.  Treated with radiation.    Past Surgical History  Procedure Laterality Date  . Dialysis fistula creation Right 2011    arm  . Cardiac catheterization  08/27/2007    couple of those  . Renal biopsy Left   . Insertion of dialysis catheter    . Removal of a dialysis catheter    . Av fistula placement Left     x2  . Av fistula repair Left     ligation  . Total knee arthroplasty Right 08/11/2013    Procedure: RIGHT TOTAL KNEE ARTHROPLASTY;  Surgeon: Mauri Pole, MD;  Location: [facility];  Service: Orthopedics;  Laterality: Right;    Current Outpatient Prescriptions  Medication Sig Dispense Refill  . amLODipine (NORVASC) 10 MG tablet Take 10 mg by mouth every morning.       Marland Kitchen atorvastatin (LIPITOR) 20 MG tablet Take 1 tablet (20 mg total) by mouth daily.  90 tablet  1  . b complex-vitamin c-folic acid (NEPHRO-VITE) 0.8 MG TABS tablet Take 0.8 mg by mouth at bedtime.      . enalapril (VASOTEC) 20 MG  tablet Take 20 mg by mouth 2 (two) times daily.       Marland Kitchen esomeprazole (NEXIUM) 20 MG capsule Take 20 mg by mouth daily before breakfast.      . gabapentin (NEURONTIN) 100 MG capsule Take 100 mg by mouth at bedtime.      Marland Kitchen HYDROcodone-acetaminophen (NORCO) 7.5-325 MG per tablet Take 1-2 tablets by mouth every 4 (four) hours as needed for pain.  120 tablet  0  . metoprolol (LOPRESSOR) 100 MG tablet Take 100 mg by mouth 2 (two) times daily.      . SENSIPAR 30 MG tablet       . tiZANidine (ZANAFLEX) 4 MG tablet Take 1 tablet (4 mg total) by mouth every 6 (six) hours as needed (muscle spasms).  50 tablet  0   No current facility-administered medications for this visit.     No Known Allergies   Family History  Problem Relation Age of Onset  . Heart attack Mother 26    Deceased  . Heart attack Brother 60    Deceased  . Breast cancer Sister     Deceased  . Hypertension Mother   . Healthy Son   . Healthy Daughter  History   Social History  . Marital Status: Divorced    Spouse Name: N/A    Number of Children: N/A  . Years of Education: N/A   Social History Main Topics  . Smoking status: Former Smoker -- 1.00 packs/day for 20 years    Types: Cigarettes    Quit date: 11/27/1983  . Smokeless tobacco: Never Used     Comment: quit 1985  . Alcohol Use: No     Comment: stopped 1986, previously was drinking 1 pint/d x 20+ years  . Drug Use: No  . Sexual Activity: Not on file   Other Topics Concern  . Not on file   Social History Narrative   He lives in nursing home since September 2014.  Previously, he lived in a mobile home.   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   Highest level of education:  GED     REVIEW OF SYSTEMS - PERTINENT POSITIVES ONLY: 12 point review of systems negative other than HPI and PMH except for difficulty swallowing.    EXAM: Filed Vitals:   07/23/14 1050  BP: 140/90  Pulse: 89  Temp: 97.9 F (36.6 C)    Wt  Readings from Last 3 Encounters:  07/23/14 160 lb (72.576 kg)  08/15/13 142 lb 13.7 oz (64.8 kg)  08/15/13 142 lb 13.7 oz (64.8 kg)     Gen:  No acute distress.  Well nourished and well groomed.   Neurological: Alert and oriented to person, place, and time. Weakness bilateral lower extremities.  Difficulty overcoming gravity.  Head: Normocephalic and atraumatic.  Eyes: Conjunctivae are normal. Pupils are equal, round, and reactive to light. No scleral icterus.  Neck: Normal range of motion. Neck supple. No tracheal deviation or thyromegaly present.  Cardiovascular: Normal rate, regular rhythm,  and intact distal pulses.   Respiratory: Effort normal.  No respiratory distress. No chest wall tenderness.  GI: Soft.  The abdomen is soft and nontender.  There is no rebound and no guarding.  Musculoskeletal:  Extremities are nontender. atrophy of calves and thighs.   Lymphadenopathy: No cervical, preauricular, postauricular or axillary adenopathy is present Skin: Skin is warm and dry. No rash noted. No diaphoresis. No erythema. No pallor. No clubbing, cyanosis, or edema.   Psychiatric: Normal mood and affect. Behavior is normal. Judgment and thought content normal.    LABORATORY RESULTS: Available labs are reviewed  P-anca elevated Complement elevated crp elevated Cr 5.0 ESR 71 CK 535 ANA positive.    RADIOLOGY RESULTS: See E-Chart or I-Site for most recent results.  [place] MR brain - chronic small vessel disease\ MR L spine - L5-S1 bulge with some narrowing on S1 nerve roots.     ASSESSMENT AND PLAN: Weakness of both lower extremities We will plan right thigh muscle biopsy.    I reviewed risks of surgery including bleeding, infection, damage to adjacent structures, numbness.  We will try to do this next week.       Milus Height MD Surgical Oncology, General and [facility]      Visit Diagnoses: 1. Weakness of both lower  extremities     Primary Care Physician: Franciso Bend, MD  Other care team Gavin Pound, MD rheumatology Narda Amber, MD neurology

## 2014-07-23 NOTE — Progress Notes (Signed)
Called [facility] to check that this procedure is supposed to be general anesth. Usually done local-they will ck with Dr Barry Dienes

## 2014-07-23 NOTE — Assessment & Plan Note (Signed)
We will plan right thigh muscle biopsy.    I reviewed risks of surgery including bleeding, infection, damage to adjacent structures, numbness.  We will try to do this next week.

## 2014-07-26 ENCOUNTER — Encounter (HOSPITAL_BASED_OUTPATIENT_CLINIC_OR_DEPARTMENT_OTHER): Payer: Self-pay | Admitting: *Deleted

## 2014-07-26 NOTE — Progress Notes (Signed)
07/26/14 1057  OBSTRUCTIVE SLEEP APNEA  Have you ever been diagnosed with sleep apnea through a sleep study? No  Do you snore loudly (loud enough to be heard through closed doors)?  0  Do you often feel tired, fatigued, or sleepy during the daytime? 0  Has anyone observed you stop breathing during your sleep? 0  Do you have, or are you being treated for high blood pressure? 1  BMI more than 35 kg/m2? 0  Age over 70 years old? 1  Neck circumference greater than 40 cm/16 inches? 1  Gender: 1  Obstructive Sleep Apnea Score 4  Score 4 or greater  Results sent to PCP

## 2014-07-26 NOTE — Progress Notes (Signed)
i called dr Barry Dienes to be sure this was not general-?local-said it could be [employer]-? ekg

## 2014-07-28 ENCOUNTER — Ambulatory Visit (HOSPITAL_BASED_OUTPATIENT_CLINIC_OR_DEPARTMENT_OTHER): Payer: Medicare Other | Admitting: Anesthesiology

## 2014-07-28 ENCOUNTER — Encounter (HOSPITAL_BASED_OUTPATIENT_CLINIC_OR_DEPARTMENT_OTHER): Payer: Self-pay | Admitting: *Deleted

## 2014-07-28 ENCOUNTER — Ambulatory Visit (HOSPITAL_BASED_OUTPATIENT_CLINIC_OR_DEPARTMENT_OTHER)
Admission: RE | Admit: 2014-07-28 | Discharge: 2014-07-28 | Disposition: A | Discharge status: Home / Self Care | Payer: Medicare Other | Source: Ambulatory Visit | Attending: General Surgery | Admitting: General Surgery

## 2014-07-28 ENCOUNTER — Encounter (HOSPITAL_BASED_OUTPATIENT_CLINIC_OR_DEPARTMENT_OTHER): Payer: Medicare Other | Admitting: Anesthesiology

## 2014-07-28 ENCOUNTER — Encounter (HOSPITAL_BASED_OUTPATIENT_CLINIC_OR_DEPARTMENT_OTHER)
Admission: RE | Disposition: A | Discharge status: Home / Self Care | Payer: Self-pay | Source: Ambulatory Visit | Attending: General Surgery

## 2014-07-28 DIAGNOSIS — Z96659 Presence of unspecified artificial knee joint: Secondary | ICD-10-CM | POA: Diagnosis not present

## 2014-07-28 DIAGNOSIS — Z992 Dependence on renal dialysis: Secondary | ICD-10-CM | POA: Insufficient documentation

## 2014-07-28 DIAGNOSIS — Z79899 Other long term (current) drug therapy: Secondary | ICD-10-CM | POA: Insufficient documentation

## 2014-07-28 DIAGNOSIS — Z87891 Personal history of nicotine dependence: Secondary | ICD-10-CM | POA: Insufficient documentation

## 2014-07-28 DIAGNOSIS — I120 Hypertensive chronic kidney disease with stage 5 chronic kidney disease or end stage renal disease: Secondary | ICD-10-CM | POA: Insufficient documentation

## 2014-07-28 DIAGNOSIS — M1990 Unspecified osteoarthritis, unspecified site: Secondary | ICD-10-CM | POA: Insufficient documentation

## 2014-07-28 DIAGNOSIS — M6250 Muscle wasting and atrophy, not elsewhere classified, unspecified site: Secondary | ICD-10-CM | POA: Diagnosis not present

## 2014-07-28 DIAGNOSIS — K219 Gastro-esophageal reflux disease without esophagitis: Secondary | ICD-10-CM | POA: Insufficient documentation

## 2014-07-28 DIAGNOSIS — Z8546 Personal history of malignant neoplasm of prostate: Secondary | ICD-10-CM | POA: Diagnosis not present

## 2014-07-28 DIAGNOSIS — N186 End stage renal disease: Secondary | ICD-10-CM | POA: Insufficient documentation

## 2014-07-28 DIAGNOSIS — I252 Old myocardial infarction: Secondary | ICD-10-CM | POA: Insufficient documentation

## 2014-07-28 DIAGNOSIS — G609 Hereditary and idiopathic neuropathy, unspecified: Secondary | ICD-10-CM | POA: Diagnosis not present

## 2014-07-28 DIAGNOSIS — R29898 Other symptoms and signs involving the musculoskeletal system: Secondary | ICD-10-CM | POA: Diagnosis present

## 2014-07-28 HISTORY — DX: Presence of spectacles and contact lenses: Z97.3

## 2014-07-28 HISTORY — DX: Presence of dental prosthetic device (complete) (partial): Z97.2

## 2014-07-28 HISTORY — DX: Dependence on wheelchair: Z99.3

## 2014-07-28 HISTORY — PX: MUSCLE BIOPSY: SHX716

## 2014-07-28 HISTORY — DX: Weakness: R53.1

## 2014-07-28 HISTORY — DX: Complete loss of teeth, unspecified cause, unspecified class: K08.109

## 2014-07-28 LAB — POCT I-STAT, CHEM 8
BUN: 37 mg/dL — ABNORMAL HIGH (ref 6–23)
CALCIUM ION: 1.1 mmol/L — AB (ref 1.13–1.30)
Chloride: 92 mEq/L — ABNORMAL LOW (ref 96–112)
Creatinine, Ser: 4.5 mg/dL — ABNORMAL HIGH (ref 0.50–1.35)
Glucose, Bld: 78 mg/dL (ref 70–99)
HEMATOCRIT: 41 % (ref 39.0–52.0)
Hemoglobin: 13.9 g/dL (ref 13.0–17.0)
Potassium: 4.4 mEq/L (ref 3.7–5.3)
Sodium: 136 mEq/L — ABNORMAL LOW (ref 137–147)
TCO2: 34 mmol/L (ref 0–100)

## 2014-07-28 SURGERY — MUSCLE BIOPSY
Anesthesia: General | Laterality: Right

## 2014-07-28 MED ORDER — FENTANYL CITRATE 0.05 MG/ML IJ SOLN
INTRAMUSCULAR | Status: DC | PRN
  Administered 2014-07-28: 50 ug via INTRAVENOUS

## 2014-07-28 MED ORDER — OXYCODONE HCL 5 MG PO TABS: 5.0000 mg | ORAL_TABLET | Freq: Once | ORAL | Status: DC | PRN

## 2014-07-28 MED ORDER — PHENYLEPHRINE HCL 10 MG/ML IJ SOLN
INTRAMUSCULAR | Status: DC | PRN
  Administered 2014-07-28 (×2): 40 ug via INTRAVENOUS

## 2014-07-28 MED ORDER — FENTANYL CITRATE 0.05 MG/ML IJ SOLN
25.0000 ug | INTRAMUSCULAR | Status: DC | PRN
  Administered 2014-07-28: 50 ug via INTRAVENOUS
  Administered 2014-07-28: 25 ug via INTRAVENOUS

## 2014-07-28 MED ORDER — MIDAZOLAM HCL 2 MG/2ML IJ SOLN
INTRAMUSCULAR | Status: AC
  Filled 2014-07-28: qty 2

## 2014-07-28 MED ORDER — OXYCODONE HCL 5 MG/5ML PO SOLN: 5.0000 mg | Freq: Once | ORAL | Status: DC | PRN

## 2014-07-28 MED ORDER — MEPERIDINE HCL 25 MG/ML IJ SOLN: 6.2500 mg | INTRAMUSCULAR | Status: DC | PRN

## 2014-07-28 MED ORDER — CEFAZOLIN SODIUM-DEXTROSE 2-3 GM-% IV SOLR
INTRAVENOUS | Status: AC
  Filled 2014-07-28: qty 50

## 2014-07-28 MED ORDER — BUPIVACAINE HCL (PF) 0.25 % IJ SOLN
INTRAMUSCULAR | Status: DC | PRN
  Administered 2014-07-28: 5 mL

## 2014-07-28 MED ORDER — LACTATED RINGERS IV SOLN: INTRAVENOUS | Status: DC

## 2014-07-28 MED ORDER — HYDROCODONE-ACETAMINOPHEN 7.5-325 MG PO TABS: 1.0000 | ORAL_TABLET | ORAL | Status: AC | PRN

## 2014-07-28 MED ORDER — FENTANYL CITRATE 0.05 MG/ML IJ SOLN: 50.0000 ug | INTRAMUSCULAR | Status: DC | PRN

## 2014-07-28 MED ORDER — SODIUM CHLORIDE 0.9 % IV SOLN
INTRAVENOUS | Status: DC
  Administered 2014-07-28: 08:00:00 via INTRAVENOUS

## 2014-07-28 MED ORDER — FENTANYL CITRATE 0.05 MG/ML IJ SOLN
INTRAMUSCULAR | Status: AC
  Filled 2014-07-28: qty 2

## 2014-07-28 MED ORDER — DEXAMETHASONE SODIUM PHOSPHATE 4 MG/ML IJ SOLN
INTRAMUSCULAR | Status: DC | PRN
  Administered 2014-07-28: 4 mg via INTRAVENOUS

## 2014-07-28 MED ORDER — LIDOCAINE-EPINEPHRINE (PF) 1 %-1:200000 IJ SOLN
INTRAMUSCULAR | Status: DC | PRN
  Administered 2014-07-28: 5 mL

## 2014-07-28 MED ORDER — BUPIVACAINE HCL (PF) 0.25 % IJ SOLN
INTRAMUSCULAR | Status: AC
  Filled 2014-07-28: qty 30

## 2014-07-28 MED ORDER — PROMETHAZINE HCL 25 MG/ML IJ SOLN: 6.2500 mg | INTRAMUSCULAR | Status: DC | PRN

## 2014-07-28 MED ORDER — LIDOCAINE HCL (PF) 1 % IJ SOLN
INTRAMUSCULAR | Status: AC
  Filled 2014-07-28: qty 30

## 2014-07-28 MED ORDER — CEFAZOLIN SODIUM-DEXTROSE 2-3 GM-% IV SOLR
2.0000 g | INTRAVENOUS | Status: AC
  Administered 2014-07-28: 2 g via INTRAVENOUS

## 2014-07-28 MED ORDER — BUPIVACAINE-EPINEPHRINE (PF) 0.25% -1:200000 IJ SOLN
INTRAMUSCULAR | Status: AC
  Filled 2014-07-28: qty 30

## 2014-07-28 MED ORDER — MIDAZOLAM HCL 2 MG/2ML IJ SOLN: 1.0000 mg | INTRAMUSCULAR | Status: DC | PRN

## 2014-07-28 SURGICAL SUPPLY — 38 items
BLADE HEX COATED 2.75 (ELECTRODE) ×2 IMPLANT
BLADE SURG 15 STRL LF DISP TIS (BLADE) ×1 IMPLANT
BLADE SURG 15 STRL SS (BLADE) ×1
COVER MAYO STAND STRL (DRAPES) ×2 IMPLANT
COVER TABLE BACK 60X90 (DRAPES) ×2 IMPLANT
DECANTER SPIKE VIAL GLASS SM (MISCELLANEOUS) IMPLANT
DEPRESSOR TONGUE BLADE STERILE (MISCELLANEOUS) ×2 IMPLANT
DERMABOND ADVANCED (GAUZE/BANDAGES/DRESSINGS) ×1
DERMABOND ADVANCED .7 DNX12 (GAUZE/BANDAGES/DRESSINGS) ×1 IMPLANT
DRAPE PED LAPAROTOMY (DRAPES) ×2 IMPLANT
DRAPE UTILITY XL STRL (DRAPES) ×2 IMPLANT
ELECT REM PT RETURN 9FT ADLT (ELECTROSURGICAL) ×2
ELECTRODE REM PT RTRN 9FT ADLT (ELECTROSURGICAL) ×1 IMPLANT
GLOVE BIO SURGEON STRL SZ 6 (GLOVE) ×2 IMPLANT
GLOVE BIOGEL PI IND STRL 6.5 (GLOVE) ×1 IMPLANT
GLOVE BIOGEL PI INDICATOR 6.5 (GLOVE) ×1
GOWN STRL REUS W/ TWL LRG LVL3 (GOWN DISPOSABLE) ×1 IMPLANT
GOWN STRL REUS W/TWL 2XL LVL3 (GOWN DISPOSABLE) ×2 IMPLANT
GOWN STRL REUS W/TWL LRG LVL3 (GOWN DISPOSABLE) ×1
NEEDLE HYPO 25X1 1.5 SAFETY (NEEDLE) ×2 IMPLANT
NS IRRIG 1000ML POUR BTL (IV SOLUTION) IMPLANT
PACK BASIN DAY SURGERY FS (CUSTOM PROCEDURE TRAY) ×2 IMPLANT
PENCIL BUTTON HOLSTER BLD 10FT (ELECTRODE) ×2 IMPLANT
SLEEVE SCD COMPRESS KNEE MED (MISCELLANEOUS) ×2 IMPLANT
SPONGE LAP 4X18 X RAY DECT (DISPOSABLE) ×2 IMPLANT
STAPLER VISISTAT (STAPLE) IMPLANT
SUT MNCRL AB 4-0 PS2 18 (SUTURE) ×2 IMPLANT
SUT SILK 2 0 TIES 17X18 (SUTURE) ×1
SUT SILK 2-0 18XBRD TIE BLK (SUTURE) ×1 IMPLANT
SUT VIC AB 3-0 FS2 27 (SUTURE) IMPLANT
SUT VIC AB 3-0 SH 27 (SUTURE) ×1
SUT VIC AB 3-0 SH 27X BRD (SUTURE) ×1 IMPLANT
SUT VICRYL 3-0 RB1 (SUTURE) IMPLANT
SUT VICRYL AB 3 0 TIES (SUTURE) IMPLANT
SYR CONTROL 10ML LL (SYRINGE) ×2 IMPLANT
TAPE HYPAFIX 4 X10 (GAUZE/BANDAGES/DRESSINGS) IMPLANT
TOWEL OR NON WOVEN STRL DISP B (DISPOSABLE) ×2 IMPLANT
TRAY DSU PREP LF (CUSTOM PROCEDURE TRAY) ×2 IMPLANT

## 2014-07-28 NOTE — Op Note (Signed)
PRE-OPERATIVE DIAGNOSIS: BLE weakness  POST-OPERATIVE DIAGNOSIS:  Same  PROCEDURE:  Procedure(s): Right thigh muscle biopsy  SURGEON:  Surgeon(s): Stark Klein, MD  ANESTHESIA:   local and general  DRAINS: none   LOCAL MEDICATIONS USED:  MARCAINE    and LIDOCAINE   SPECIMEN:  Source of Specimen:  right rectus  DISPOSITION OF SPECIMEN:  PATHOLOGY  COUNTS:  YES  DICTATION: .Dragon Dictation  PLAN OF CARE: Discharge to home after PACU  PATIENT DISPOSITION:  PACU - hemodynamically stable.  EBL minimal  Findings:  Muscle appears grossly normal, just atrophied in volume  PROCEDURE:   Patient was identified in the holding area and taken the operating room where he was placed supine on the operating room table. General anesthesia was induced the LMA anesthesia. The right thigh was prepped and draped in sterile fashion. Timeout was performed according to the surgical safety checklist. When all was correct, we continued. A vertical incision was made approximately 4 cm in length on the anterior thigh. Extensive local anesthetic was administered. The subcutaneous tissues were divided with the cautery. A Wietlaner retractor was used for visualization.   The rectus fascia was opened sharply with the Metzenbaum scissors. A 3 cm piece of muscle was dissected out with a tonsil. This was clamped on both ends with hemostats. Metzenbaum scissors were used to resect a piece of muscle. This was tied down to the tongue depressor to keep it in stretch. The 2 ends of the muscle were tied off with 2-0 silk ties. The wound was irrigated.    The skin was reapproximated with 3-0 Vicryl interrupted deep dermal suture and 4-0 Monocryl running subcuticular suture. The incision was then cleaned, dried, and dressed with Dermabond and Steri-Strips. Patient was awakened from anesthesia and taken to the PACU in stable condition. Needle, sponge, instrument counts were correct x2.

## 2014-07-28 NOTE — Discharge Instructions (Addendum)
Post Anesthesia Home Care Instructions  Activity: Get plenty of rest for the remainder of the day. A responsible adult should stay with you for 24 hours following the procedure.  For the next 24 hours, DO NOT: -Drive a car -Paediatric nurse -Drink alcoholic beverages -Take any medication unless instructed by your physician -Make any legal decisions or sign important papers.  Meals: Start with liquid foods such as gelatin or soup. Progress to regular foods as tolerated. Avoid greasy, spicy, heavy foods. If nausea and/or vomiting occur, drink only clear liquids until the nausea and/or vomiting subsides. Call your physician if vomiting continues.  Special Instructions/Symptoms: Your throat may feel dry or sore from the anesthesia or the breathing tube placed in your throat during surgery. If this causes discomfort, gargle with warm salt water. The discomfort should disappear within 24 hours.  [facility] Phone Number [phone]   POST OP INSTRUCTIONS  Always review your discharge instruction sheet given to you by the facility where your surgery was performed.  IF YOU HAVE DISABILITY OR FAMILY LEAVE FORMS, YOU MUST BRING THEM TO THE OFFICE FOR PROCESSING.  DO NOT GIVE THEM TO YOUR DOCTOR.  1. A prescription for pain medication may be given to you upon discharge.  Take your pain medication as prescribed, if needed.  If narcotic pain medicine is not needed, then you may take acetaminophen (Tylenol) or ibuprofen (Advil) as needed. 2. Take your usually prescribed medications unless otherwise directed 3. If you need a refill on your pain medication, please contact your pharmacy.  They will contact our office to request authorization.  Prescriptions will not be filled after 5pm or on week-ends. 4. You should eat very light the first 24 hours after surgery, such as soup, crackers, pudding, etc.  Resume your normal diet the day after surgery 5. It is common to experience  some constipation if taking pain medication after surgery.  Increasing fluid intake and taking a stool softener will usually help or prevent this problem from occurring.  A mild laxative (Milk of Magnesia or Miralax) should be taken according to package directions if there are no bowel movements after 48 hours. 6. You may shower in 48 hours.  The surgical glue will flake off in 2-3 weeks.   7. ACTIVITIES:  No strenuous activity or heavy lifting for 1 week.   a. You may drive when you no longer are taking prescription pain medication, you can comfortably wear a seatbelt, and you can safely maneuver your car and apply brakes. b. RETURN TO WORK:  _________n/a_____________ Dennis Bast should see your doctor in the office for a follow-up appointment approximately three-four weeks after your surgery.    WHEN TO CALL YOUR DOCTOR: 1. Fever over 101.0 2. Nausea and/or vomiting. 3. Extreme swelling or bruising. 4. Continued bleeding from incision. 5. Increased pain, redness, or drainage from the incision.  The clinic staff is available to answer your questions during regular business hours.  Please dont hesitate to call and ask to speak to one of the nurses for clinical concerns.  If you have a medical emergency, go to the nearest emergency room or call 911.  A surgeon from [facility] is always on call at the hospital.  For further questions, please visit [email or web address]     Post Anesthesia Home Care Instructions  Activity: Get plenty of rest for the remainder of the day. A responsible adult should stay with you for 24 hours following the procedure.  For the next 24  hours, DO NOT: -Drive a car -Paediatric nurse -Drink alcoholic beverages -Take any medication unless instructed by your physician -Make any legal decisions or sign important papers.  Meals: Start with liquid foods such as gelatin or soup. Progress to regular foods as tolerated. Avoid greasy, spicy, heavy foods. If  nausea and/or vomiting occur, drink only clear liquids until the nausea and/or vomiting subsides. Call your physician if vomiting continues.  Special Instructions/Symptoms: Your throat may feel dry or sore from the anesthesia or the breathing tube placed in your throat during surgery. If this causes discomfort, gargle with warm salt water. The discomfort should disappear within 24 hours.

## 2014-07-28 NOTE — Transfer of Care (Signed)
Immediate Anesthesia Transfer of Care Note  Patient: Christopher [name]  Procedure(s) Performed: Procedure(s): RIGHT THIGH MUSCLE BIOPSY (Right)  Patient Location: PACU  Anesthesia Type:General  Level of Consciousness: sedated  Airway & Oxygen Therapy: Patient Spontanous Breathing and Patient connected to face mask oxygen  Post-op Assessment: Report given to PACU RN and Post -op Vital signs reviewed and stable  Post vital signs: Reviewed and stable  Complications: No apparent anesthesia complications

## 2014-07-28 NOTE — H&P (View-Only) (Signed)
Chief Complaint  Patient presents with  . Rt thigh bx    Referring MD: Dr. Angela Hawkes  HISTORY:  Pt is a 68 yo M referred by Dr. Hawkes for right muscle biopsy.  He started having weakness over 1 year ago.  This has been progressive.  He has had multiple falls and been unable to lift himself up off the ground.  This got worse after right knee replacement.  He has a history of sjogren syndrome, but Dr. Hawkes is concerned that he may have lupus.  Muscle biopsy is requested for assistance with diagnosis.    Past Medical History  Diagnosis Date  . SLE-Sjogren overlap syndrome   . BPH (benign prostatic hypertrophy)   . Esophageal reflux   . Unspecified essential hypertension   . Osteoarthritis   . History of pancreatitis   . Bladder outlet obstruction     calculi  . History of non-ST elevation myocardial infarction (NSTEMI) 2005  . Dialysis patient   . Headache(784.0)   . Chronic kidney disease     Hemodialysis in [place].  TUe, Thurs, Sat  . Cancer 2008    prostate cancer.  Treated with radiation.    Past Surgical History  Procedure Laterality Date  . Dialysis fistula creation Right 2011    arm  . Cardiac catheterization  08/27/2007    couple of those  . Renal biopsy Left   . Insertion of dialysis catheter    . Removal of a dialysis catheter    . Av fistula placement Left     x2  . Av fistula repair Left     ligation  . Total knee arthroplasty Right 08/11/2013    Procedure: RIGHT TOTAL KNEE ARTHROPLASTY;  Surgeon: Matthew D Olin, MD;  Location: [facility] OR;  Service: Orthopedics;  Laterality: Right;    Current Outpatient Prescriptions  Medication Sig Dispense Refill  . amLODipine (NORVASC) 10 MG tablet Take 10 mg by mouth every morning.       . atorvastatin (LIPITOR) 20 MG tablet Take 1 tablet (20 mg total) by mouth daily.  90 tablet  1  . b complex-vitamin c-folic acid (NEPHRO-VITE) 0.8 MG TABS tablet Take 0.8 mg by mouth at bedtime.      . enalapril (VASOTEC) 20 MG  tablet Take 20 mg by mouth 2 (two) times daily.       . esomeprazole (NEXIUM) 20 MG capsule Take 20 mg by mouth daily before breakfast.      . gabapentin (NEURONTIN) 100 MG capsule Take 100 mg by mouth at bedtime.      . HYDROcodone-acetaminophen (NORCO) 7.5-325 MG per tablet Take 1-2 tablets by mouth every 4 (four) hours as needed for pain.  120 tablet  0  . metoprolol (LOPRESSOR) 100 MG tablet Take 100 mg by mouth 2 (two) times daily.      . SENSIPAR 30 MG tablet       . tiZANidine (ZANAFLEX) 4 MG tablet Take 1 tablet (4 mg total) by mouth every 6 (six) hours as needed (muscle spasms).  50 tablet  0   No current facility-administered medications for this visit.     No Known Allergies   Family History  Problem Relation Age of Onset  . Heart attack Mother 72    Deceased  . Heart attack Brother 47    Deceased  . Breast cancer Sister     Deceased  . Hypertension Mother   . Healthy Son   . Healthy Daughter        History   Social History  . Marital Status: Divorced    Spouse Name: N/A    Number of Children: N/A  . Years of Education: N/A   Social History Main Topics  . Smoking status: Former Smoker -- 1.00 packs/day for 20 years    Types: Cigarettes    Quit date: 11/27/1983  . Smokeless tobacco: Never Used     Comment: quit 1985  . Alcohol Use: No     Comment: stopped 1986, previously was drinking 1 pint/d x 20+ years  . Drug Use: No  . Sexual Activity: Not on file   Other Topics Concern  . Not on file   Social History Narrative   He lives in nursing home since September 2014.  Previously, he lived in a mobile home.   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   Highest level of education:  GED     REVIEW OF SYSTEMS - PERTINENT POSITIVES ONLY: 12 point review of systems negative other than HPI and PMH except for difficulty swallowing.    EXAM: Filed Vitals:   07/23/14 1050  BP: 140/90  Pulse: 89  Temp: 97.9 F (36.6 C)    Wt  Readings from Last 3 Encounters:  07/23/14 160 lb (72.576 kg)  08/15/13 142 lb 13.7 oz (64.8 kg)  08/15/13 142 lb 13.7 oz (64.8 kg)     Gen:  No acute distress.  Well nourished and well groomed.   Neurological: Alert and oriented to person, place, and time. Weakness bilateral lower extremities.  Difficulty overcoming gravity.  Head: Normocephalic and atraumatic.  Eyes: Conjunctivae are normal. Pupils are equal, round, and reactive to light. No scleral icterus.  Neck: Normal range of motion. Neck supple. No tracheal deviation or thyromegaly present.  Cardiovascular: Normal rate, regular rhythm,  and intact distal pulses.   Respiratory: Effort normal.  No respiratory distress. No chest wall tenderness.  GI: Soft.  The abdomen is soft and nontender.  There is no rebound and no guarding.  Musculoskeletal:  Extremities are nontender. atrophy of calves and thighs.   Lymphadenopathy: No cervical, preauricular, postauricular or axillary adenopathy is present Skin: Skin is warm and dry. No rash noted. No diaphoresis. No erythema. No pallor. No clubbing, cyanosis, or edema.   Psychiatric: Normal mood and affect. Behavior is normal. Judgment and thought content normal.    LABORATORY RESULTS: Available labs are reviewed  P-anca elevated Complement elevated crp elevated Cr 5.0 ESR 71 CK 535 ANA positive.    RADIOLOGY RESULTS: See E-Chart or I-Site for most recent results.  [facility] MR brain - chronic small vessel disease\ MR L spine - L5-S1 bulge with some narrowing on S1 nerve roots.     ASSESSMENT AND PLAN: Weakness of both lower extremities We will plan right thigh muscle biopsy.    I reviewed risks of surgery including bleeding, infection, damage to adjacent structures, numbness.  We will try to do this next week.       Chasmine Lender L Maricella Filyaw MD Surgical Oncology, General and Endocrine Surgery [facility]      Visit Diagnoses: 1. Weakness of both lower  extremities     Primary Care Physician: Voora, Raven A, MD  Other care team Angela Hawkes, MD rheumatology Donika Patel, MD neurology    

## 2014-07-28 NOTE — Anesthesia Procedure Notes (Signed)
Procedure Name: LMA Insertion Date/Time: 07/28/2014 8:41 AM Performed by: Maryella Shivers Pre-anesthesia Checklist: Patient identified, Emergency Drugs available, Suction available and Patient being monitored Patient Re-evaluated:Patient Re-evaluated prior to inductionOxygen Delivery Method: Circle System Utilized Preoxygenation: Pre-oxygenation with 100% oxygen Intubation Type: IV induction Ventilation: Mask ventilation without difficulty LMA: LMA inserted LMA Size: 5.0 Number of attempts: 1 Airway Equipment and Method: bite block Placement Confirmation: positive ETCO2 Tube secured with: Tape Dental Injury: Teeth and Oropharynx as per pre-operative assessment

## 2014-07-28 NOTE — Anesthesia Preprocedure Evaluation (Addendum)
Anesthesia Evaluation  Patient identified by MRN, date of birth, ID band Patient awake    Reviewed: Allergy & Precautions, H&P , NPO status , Patient's Chart, lab work & pertinent test results  History of Anesthesia Complications Negative for: history of anesthetic complications  Airway Mallampati: II TM Distance: >3 FB Neck ROM: Full    Dental  (+) Edentulous Upper, Edentulous Lower   Pulmonary former smoker (quit 1985),  breath sounds clear to auscultation        Cardiovascular hypertension, Pt. on medications and Pt. on home beta blockers + Past MI (from volume overload with ESRD?) - CAD Rhythm:Regular Rate:Normal  (9/14) Lexiscan Cardiolite: no ischemia or infarction, EF was 38%.  Echo (7/14): EF 55-60%, mild MR.   Cath x4: non-obstructive ASCADz   Neuro/Psych Pt does not walk or stand  Neuromuscular disease (peripheral neuropathy)    GI/Hepatic Neg liver ROS, GERD-  Medicated and Controlled,  Endo/Other  Sjogren's  Renal/GU ESRF and DialysisRenal disease (TuThSa)   Prostate Ca- XRT    Musculoskeletal   Abdominal   Peds  Hematology negative hematology ROS (+)   Anesthesia Other Findings   Reproductive/Obstetrics                        Anesthesia Physical Anesthesia Plan  ASA: III  Anesthesia Plan: General   Post-op Pain Management:    Induction: Intravenous  Airway Management Planned: LMA  Additional Equipment:   Intra-op Plan:   Post-operative Plan:   Informed Consent: I have reviewed the patients History and Physical, chart, labs and discussed the procedure including the risks, benefits and alternatives for the proposed anesthesia with the patient or authorized representative who has indicated his/her understanding and acceptance.     Plan Discussed with: CRNA and Surgeon  Anesthesia Plan Comments: (Plan routine monitors, GA- LMA OK)        Anesthesia Quick  Evaluation

## 2014-07-28 NOTE — Interval H&P Note (Signed)
History and Physical Interval Note:  07/28/2014 8:28 AM  Christopher [name]  has presented today for surgery, with the diagnosis of Bilateral Lower Extremity Weakness  The various methods of treatment have been discussed with the patient and family. After consideration of risks, benefits and other options for treatment, the patient has consented to  Procedure(s): RIGHT THIGH MUSCLE BIOPSY (Right) as a surgical intervention .  The patient's history has been reviewed, patient examined, no change in status, stable for surgery.  I have reviewed the patient's chart and labs.  Questions were answered to the patient's satisfaction.     Iviona Hole

## 2014-07-28 NOTE — Anesthesia Postprocedure Evaluation (Signed)
  Anesthesia Post-op Note  Patient: Christopher [name]  Procedure(s) Performed: Procedure(s): RIGHT THIGH MUSCLE BIOPSY (Right)  Patient Location: PACU  Anesthesia Type:General  Level of Consciousness: awake, alert , oriented and patient cooperative  Airway and Oxygen Therapy: Patient Spontanous Breathing  Post-op Pain: none  Post-op Assessment: Post-op Vital signs reviewed, Patient's Cardiovascular Status Stable, Respiratory Function Stable, Patent Airway, No signs of Nausea or vomiting and Pain level controlled  Post-op Vital Signs: Reviewed and stable  Last Vitals:  Filed Vitals:   07/28/14 1030  BP: 115/64  Pulse: 64  Temp:   Resp: 9    Complications: No apparent anesthesia complications

## 2014-07-29 ENCOUNTER — Encounter (HOSPITAL_BASED_OUTPATIENT_CLINIC_OR_DEPARTMENT_OTHER): Payer: Self-pay | Admitting: General Surgery

## 2014-08-09 ENCOUNTER — Telehealth (INDEPENDENT_AMBULATORY_CARE_PROVIDER_SITE_OTHER): Payer: Self-pay | Admitting: General Surgery

## 2014-08-09 NOTE — Telephone Encounter (Signed)
Discussed + muscle biopsy with patient.  Recommended to patient that he call and get appointments with neurology and rheumatology to discuss implications of biopsy and treatment.

## 2014-08-12 ENCOUNTER — Encounter (HOSPITAL_COMMUNITY): Payer: Self-pay

## 2014-08-16 ENCOUNTER — Telehealth: Payer: Self-pay | Admitting: Neurology

## 2014-08-16 NOTE — Telephone Encounter (Signed)
Pathology faxed to Dr Gavin Pound at [phone] with confirmation received.

## 2014-08-20 ENCOUNTER — Encounter (HOSPITAL_COMMUNITY): Payer: Self-pay

## 2014-09-15 ENCOUNTER — Ambulatory Visit (INDEPENDENT_AMBULATORY_CARE_PROVIDER_SITE_OTHER): Payer: Medicare Other | Admitting: Neurology

## 2014-09-15 ENCOUNTER — Encounter: Payer: Self-pay | Admitting: Neurology

## 2014-09-15 VITALS — BP 124/64 | HR 86 | Temp 98.1°F

## 2014-09-15 DIAGNOSIS — M359 Systemic involvement of connective tissue, unspecified: Secondary | ICD-10-CM

## 2014-09-15 DIAGNOSIS — G63 Polyneuropathy in diseases classified elsewhere: Secondary | ICD-10-CM

## 2014-09-15 DIAGNOSIS — M6281 Muscle weakness (generalized): Secondary | ICD-10-CM

## 2014-09-15 DIAGNOSIS — G1220 Motor neuron disease, unspecified: Secondary | ICD-10-CM

## 2014-09-15 NOTE — Patient Instructions (Signed)
I will communicate with Dr. Trudie Reed after your appointment with her regarding ongoing management of your weakness Return to clinic in 91-months, or sooner as needed.

## 2014-09-15 NOTE — Progress Notes (Signed)
Follow-up Visit   Date: 09/15/2014    Christopher [name] MRN: [id number] DOB: July 07, 1946   Interim History: Christopher [name] is a 68 y.o. right-handed African American male with history of CAD, hyperlipidemia, hypertension, SLE-Sjogren's syndrome, BPH, ESRD on dialysis (T-Th-Sat, since 2005), and prostate cancer s/p radiation (2008) returning to the clinic for follow-up of bilateral leg weakness.   History of present illness: Starting around 2010, he developed gradual onset of bilateral leg weakness. There was no preceding illness, infection, or trauma. He started noticing difficulty getting up out of a chair, standing, and walking. If he was using a walker, it was easier to walk. He has fallen about 5-6 times at home and would have to crawl to furniture to pull himself up. His last fall was 3 years ago. He is wheelchair bound. He endorses occasional back pain. He has numbness and tingling of the feet and hands. He requires assistance for bathing, dressing, and transfers. He is able to lift his legs up, but they are too weak to support his weight.  He also had a long history of right knee pain due to severe arthritis of the knee and underwent right total knee replacement in September 2014. Prior to surgery, he was having difficulty walking, but since surgery, his weakness is worse, because he cannot even help himself out of a chair.   He has been diagnosed with SLE-Sjogren's syndrome and was not on any immunomodulatory therapy. At one point, he was being seen at [facility] for renal transplant, however due to his undiagnosed weakness, he was deemed not to be a candidate.  He saw Dr. Melrose Nakayama, Neurologist at [facility], in December 2014, who ordered CK, NCS, and imaging of the lumbar spine. NCS shows absent sural, tibial and peroneal motor responses. For further evaluation, patient was referred to me.  - Follow-up 04/09/2014:   He endorses difficulty with swallowing, especially solid foods.   He  has occasional painless muscle twitches in the hands.  EMG of the left side which shows active and chronic motor axonal loss changes affecting the bulbar, cervical, thoracic, and lumbosacral regions most concerning for motor neuron disease. There is also evidence of a length dependent axonal loss and demyelinating peripheral neuropathy  - Follow-up 07/14/2014:  He saw Dr. Trudie Reed (rheumatology) who evaluated the patient and recommends muscle biopsy to be sure we are not missing a myopathy.  He restarted therapy and was able to stand with assistance for at least a minute.   UPDATE 09/15/2014:  He underwent muscle biopsy of the right thigh which showed mild lymphocytic and endomysial inflammation and mild fiber necrosis/regeneration consistent with inflammatory myopathy.  He subsequently followed up with Dr. Trudie Reed and was started on prednisone 51m daily since mid-September and reports having increased appetite and nervousness at times.  He feels that initially there was mild movement, but nothing significant or lasting.  He denies any new weakness.      Medications:  Current Outpatient Prescriptions on File Prior to Visit  Medication Sig Dispense Refill  . amLODipine (NORVASC) 10 MG tablet Take 2.5 mg by mouth every morning.       .Marland Kitchenatorvastatin (LIPITOR) 20 MG tablet Take 1 tablet (20 mg total) by mouth daily.  90 tablet  1  . b complex-vitamin c-folic acid (NEPHRO-VITE) 0.8 MG TABS tablet Take 0.8 mg by mouth at bedtime.      . enalapril (VASOTEC) 20 MG tablet Take 20 mg by mouth 2 (two) times daily.       .Marland Kitchen  esomeprazole (NEXIUM) 20 MG capsule Take 20 mg by mouth daily before breakfast.      . gabapentin (NEURONTIN) 100 MG capsule Take 300 mg by mouth at bedtime.       Marland Kitchen HYDROcodone-acetaminophen (NORCO) 7.5-325 MG per tablet Take 1-2 tablets by mouth every 4 (four) hours as needed.  40 tablet  0  . metoprolol (LOPRESSOR) 100 MG tablet Take 50 mg by mouth 2 (two) times daily.       . SENSIPAR 30 MG  tablet       . tiZANidine (ZANAFLEX) 4 MG tablet Take 1 tablet (4 mg total) by mouth every 6 (six) hours as needed (muscle spasms).  50 tablet  0   No current facility-administered medications on file prior to visit.    Allergies: No Known Allergies   Review of Systems:  CONSTITUTIONAL: No fevers, chills, night sweats, no weight loss.   EYES: No visual changes or eye pain ENT: No hearing changes.  No history of nose bleeds.   RESPIRATORY: No cough, wheezing and shortness of breath.   CARDIOVASCULAR: Negative for chest pain, and palpitations.   GI: Negative for abdominal discomfort, blood in stools or black stools.  No recent change in bowel habits.   GU:  No history of incontinence.   MUSCLOSKELETAL: No history of joint pain or swelling.  No myalgias.   SKIN: Negative for lesions, rash, and itching.   ENDOCRINE: Negative for cold or heat intolerance, polydipsia or goiter.   PSYCH:  No depression or anxiety symptoms.   NEURO: As Above.   Vital Signs:  BP 124/64  Pulse 86  Temp(Src) 98.1 F (36.7 C)  SpO2 99% General: Moon facies due to steroids  Neurological Exam: MENTAL STATUS including orientation to time, place, person is intact. Speech is not dysarthric.  CRANIAL NERVES:  Pupils equal round and reactive to light.  Normal conjugate, extra-ocular eye movements in all directions of gaze.  No ptosis.  Face is symmetric.  There is no motor weakness of the facial muscles. Palate elevates symmetrically.  Tongue is midline with mild weakness on protrusion to the right. No tongue fasciculations.  Snout this present (L >R).   MOTOR: Marked atrophy of quadriceps, gastrocnemius, TA muscles bilaterally, mild atrophy of bilateral FDI muscles. No fasciculations or abnormal movements.  Neck flexion is 5-/5 (improved) Neck extension 5/5  Right Upper Extremity:    Left Upper Extremity:    Deltoid  5/5   Deltoid  5-/5 (improved)  Biceps  5-/5   Biceps  5-/5   Triceps  5-/5  Triceps  4+/5    Wrist extensors  4/5  Wrist extensors  5-/5   Wrist flexors  4+/5   Wrist flexors  5-/5   Finger extensors  4/5  Finger extensors  4+/5   Finger flexors  4/5  Finger flexors  4+/5  Dorsal interossei  4/5  Dorsal interossei  4+/5   Abductor pollicis  4/5  Abductor pollicis  5-/5   Tone (Ashworth scale)  0   Tone (Ashworth scale)  0    Right Lower Extremity:    Left Lower Extremity:    Hip flexors  4/5  Hip flexors  4-5  Hip extensors  4/5   Hip extensors  4/5   Adductors  3/5   Adductors  3/5   Abductors  5-/5   Abductor  5-/5   Knee flexors  4-/5   Knee flexors  4-/5   Knee extensors  3/5  Knee extensors  3/5   Dorsiflexors  4/   Dorsiflexors  3+5   Plantarflexors  3/5   Plantarflexors  3+/5   Toe extensors  3+/5  Toe extensors  3+/5   Toe flexors  3+/5  Toe flexors  3+/5   Tone (Ashworth scale)  0   Tone (Ashworth scale)  0    MSRs:  Reflexes are absent throughout upper and lower extremities.    COORDINATION/GAIT:  Not tested, patient wheelchair bound.  Slowed finger tapping due to weakness.  Data: Labs 02/24/2014: GM 1 antibody negative, aldolase 4.3, vitamin B12 448, CK 330* Labs 04/09/2014:  Double-stranded DNA negative ribosomal protein antibody negative, SSA >8.0*, SSB neg, centromere 3.7*, ENA neg, RNP neg, Scl-70 neg, Jo1 neg, ANA positive 1:40, ESR 77, CRP 5.2  MRI of lumbar spine 11-30-2013: At L5-S1 there is a mild broad-based disc bulge with more focal central disc component which abuts the bilateral intraspinal S1 nerve roots. There is moderate left foraminal stenosis. There is mild right foraminal stenosis.  MRI brain 01/18/2014:  No acute process.  Chronic small vessel disease.  EMG 04/07/2014: 1. There is electrophysiologic evidence of a severe generalized sensorimotor polyneuropathy, axon loss and demyelinating in type, affecting the left side, conforming to a length-dependent pattern. 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 2. There is a superimposed wide spread disorder of the anterior horn cells affecting the bulbar, cervical, thoracic, and lumbosacral regions, as is seen in motor neuron disease (MND) or amyotrophic lateral sclerosis (ALS). Repeat EMG in 3-6 months may be indicated to evaluate the degree of ongoing axon motor loss.  Right thigh muscle biopsy 07/28/2014:  mild lymphocytic and endomysial inflammation and mild fiber necrosis/regeneration consistent with inflammatory myopathy  IMPRESSION/PLAN: Mr. Maret is a delightful 68 year-old gentleman returning to the clinic for evaluation of bilateral lower extremity weakness. He has painless progressive weakness of the arms and legs since 2010, worse since 2014. His neurological examination shows lower motor neuron pattern of weakness involving the cervical and lumbosacral regions. He does have pathological facial reflexes and mild facial weakness. Prior imaging of his brain did not show any abnormalities and imaging of the lumbar region shows mild disc bulge at L5-S1. His labs show mildly elevated CK of 303.   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 There is also evidence of a length dependent axonal loss and demyelinating peripheral neuropathy.  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  There is no evidence of myopathy or myositis on EMG.  He has both a severe peripheral neuropathy and motor neuron dysfunction which makes it difficult to delineate as to which is contributing to his symptoms predominantly.  Given that there is active changes in the tongue on EMG, my suspicion for motor neuron disease remains high. His peripheral neuropathy is most likely due to  underlying Sjogren's disease.   To help localize the nature of his weakness, he also underwent right thigh muscle biopsy which was consistent with mild inflammatory myopathy and was started on prednisone 60 mg daily. Although his exam shows mild improvement of neck flexion and left deltoid testing, his motor exam in general remains relatively unchanged.  There continues to be several overlapping considerations including:  (1) peripheral neuropathy, (2) inflammatory myopathy, (3) and motor neuron disease.    I think it would be reasonable to taper his prednisone, but will defer this to Dr. Trudie Reed.  I explained to the patient that at this juncture, he would need to be followed clinically and see how symptoms evolve.  Return to clinic in 22-month or sooner as needed.   The duration of this appointment visit was 25 minutes of face-to-face time with the patient.  Greater than 50% of this time was spent in counseling, explanation of diagnosis, planning of further management, and coordination of care.   Thank you for allowing me to participate in patient's care.  If I can answer any additional questions, I would be pleased to do so.    Sincerely,    Camden Mazzaferro K. PPosey Pronto DO

## 2014-09-15 NOTE — Progress Notes (Signed)
Note faxed.

## 2014-09-26 ENCOUNTER — Ambulatory Visit: Payer: Self-pay | Admitting: Internal Medicine

## 2014-09-28 LAB — COMPREHENSIVE METABOLIC PANEL
Albumin: 2.4 g/dL — ABNORMAL LOW (ref 3.4–5.0)
Alkaline Phosphatase: 87 U/L
Anion Gap: 14 (ref 7–16)
BUN: 76 mg/dL — ABNORMAL HIGH (ref 7–18)
Bilirubin,Total: 0.4 mg/dL (ref 0.2–1.0)
Calcium, Total: 8 mg/dL — ABNORMAL LOW (ref 8.5–10.1)
Chloride: 97 mmol/L — ABNORMAL LOW (ref 98–107)
Co2: 27 mmol/L (ref 21–32)
Creatinine: 5.93 mg/dL — ABNORMAL HIGH (ref 0.60–1.30)
EGFR (African American): 12 — ABNORMAL LOW
GFR CALC NON AF AMER: 10 — AB
Glucose: 90 mg/dL (ref 65–99)
OSMOLALITY: 298 (ref 275–301)
Potassium: 5.9 mmol/L — ABNORMAL HIGH (ref 3.5–5.1)
SGOT(AST): 43 U/L — ABNORMAL HIGH (ref 15–37)
SGPT (ALT): 35 U/L
SODIUM: 138 mmol/L (ref 136–145)
Total Protein: 6.8 g/dL (ref 6.4–8.2)

## 2014-09-28 LAB — CBC
HCT: 36.3 % — AB (ref 40.0–52.0)
HGB: 11.7 g/dL — ABNORMAL LOW (ref 13.0–18.0)
MCH: 31.9 pg (ref 26.0–34.0)
MCHC: 32.2 g/dL (ref 32.0–36.0)
MCV: 99 fL (ref 80–100)
Platelet: 144 10*3/uL — ABNORMAL LOW (ref 150–440)
RBC: 3.67 10*6/uL — ABNORMAL LOW (ref 4.40–5.90)
RDW: 18.1 % — ABNORMAL HIGH (ref 11.5–14.5)
WBC: 6.1 10*3/uL (ref 3.8–10.6)

## 2014-09-28 LAB — LIPASE, BLOOD: Lipase: 429 U/L — ABNORMAL HIGH (ref 73–393)

## 2014-09-28 LAB — TROPONIN I: Troponin-I: 0.33 ng/mL — ABNORMAL HIGH

## 2014-09-28 LAB — CK-MB: CK-MB: 4.7 ng/mL — ABNORMAL HIGH (ref 0.5–3.6)

## 2014-09-29 ENCOUNTER — Inpatient Hospital Stay: Payer: Self-pay | Admitting: Internal Medicine

## 2014-09-29 LAB — CBC WITH DIFFERENTIAL/PLATELET
Basophil #: 0 10*3/uL (ref 0.0–0.1)
Basophil %: 0.2 %
EOS ABS: 0 10*3/uL (ref 0.0–0.7)
EOS PCT: 0.3 %
HCT: 30.9 % — ABNORMAL LOW (ref 40.0–52.0)
HGB: 10 g/dL — AB (ref 13.0–18.0)
LYMPHS ABS: 0.7 10*3/uL — AB (ref 1.0–3.6)
LYMPHS PCT: 8.7 %
MCH: 31.8 pg (ref 26.0–34.0)
MCHC: 32.5 g/dL (ref 32.0–36.0)
MCV: 98 fL (ref 80–100)
MONO ABS: 0.2 x10 3/mm (ref 0.2–1.0)
MONOS PCT: 3.1 %
Neutrophil #: 6.9 10*3/uL — ABNORMAL HIGH (ref 1.4–6.5)
Neutrophil %: 87.7 %
Platelet: 136 10*3/uL — ABNORMAL LOW (ref 150–440)
RBC: 3.16 10*6/uL — ABNORMAL LOW (ref 4.40–5.90)
RDW: 17.3 % — ABNORMAL HIGH (ref 11.5–14.5)
WBC: 7.9 10*3/uL (ref 3.8–10.6)

## 2014-09-29 LAB — RENAL FUNCTION PANEL
ANION GAP: 14 (ref 7–16)
Albumin: 2 g/dL — ABNORMAL LOW (ref 3.4–5.0)
BUN: 87 mg/dL — AB (ref 7–18)
CO2: 27 mmol/L (ref 21–32)
Calcium, Total: 7.3 mg/dL — ABNORMAL LOW (ref 8.5–10.1)
Chloride: 95 mmol/L — ABNORMAL LOW (ref 98–107)
Creatinine: 6.39 mg/dL — ABNORMAL HIGH (ref 0.60–1.30)
EGFR (Non-African Amer.): 9 — ABNORMAL LOW
GFR CALC AF AMER: 11 — AB
GLUCOSE: 92 mg/dL (ref 65–99)
Osmolality: 298 (ref 275–301)
PHOSPHORUS: 8.5 mg/dL — AB (ref 2.5–4.9)
Potassium: 5.6 mmol/L — ABNORMAL HIGH (ref 3.5–5.1)
SODIUM: 136 mmol/L (ref 136–145)

## 2014-09-29 LAB — CK TOTAL AND CKMB (NOT AT ARMC)
CK, TOTAL: 436 U/L — AB
CK-MB: 4.2 ng/mL — ABNORMAL HIGH (ref 0.5–3.6)

## 2014-09-29 LAB — CK-MB: CK-MB: 4 ng/mL — ABNORMAL HIGH (ref 0.5–3.6)

## 2014-09-29 LAB — TROPONIN I
TROPONIN-I: 0.49 ng/mL — AB
Troponin-I: 0.57 ng/mL — ABNORMAL HIGH

## 2014-09-30 LAB — CBC WITH DIFFERENTIAL/PLATELET
BASOS PCT: 0.1 %
Basophil #: 0 10*3/uL (ref 0.0–0.1)
EOS ABS: 0 10*3/uL (ref 0.0–0.7)
EOS PCT: 0.1 %
HCT: 30.9 % — ABNORMAL LOW (ref 40.0–52.0)
HGB: 10 g/dL — ABNORMAL LOW (ref 13.0–18.0)
LYMPHS PCT: 4.4 %
Lymphocyte #: 0.5 10*3/uL — ABNORMAL LOW (ref 1.0–3.6)
MCH: 32.1 pg (ref 26.0–34.0)
MCHC: 32.3 g/dL (ref 32.0–36.0)
MCV: 99 fL (ref 80–100)
MONOS PCT: 3.2 %
Monocyte #: 0.3 x10 3/mm (ref 0.2–1.0)
NEUTROS ABS: 9.4 10*3/uL — AB (ref 1.4–6.5)
NEUTROS PCT: 92.2 %
PLATELETS: 142 10*3/uL — AB (ref 150–440)
RBC: 3.11 10*6/uL — ABNORMAL LOW (ref 4.40–5.90)
RDW: 17.9 % — ABNORMAL HIGH (ref 11.5–14.5)
WBC: 10.3 10*3/uL (ref 3.8–10.6)

## 2014-09-30 LAB — COMPREHENSIVE METABOLIC PANEL
ALBUMIN: 1.8 g/dL — AB (ref 3.4–5.0)
Alkaline Phosphatase: 84 U/L
Anion Gap: 12 (ref 7–16)
BILIRUBIN TOTAL: 0.5 mg/dL (ref 0.2–1.0)
BUN: 47 mg/dL — ABNORMAL HIGH (ref 7–18)
Calcium, Total: 7.3 mg/dL — ABNORMAL LOW (ref 8.5–10.1)
Chloride: 96 mmol/L — ABNORMAL LOW (ref 98–107)
Co2: 30 mmol/L (ref 21–32)
Creatinine: 4.73 mg/dL — ABNORMAL HIGH (ref 0.60–1.30)
EGFR (African American): 16 — ABNORMAL LOW
EGFR (Non-African Amer.): 13 — ABNORMAL LOW
Glucose: 94 mg/dL (ref 65–99)
Osmolality: 288 (ref 275–301)
Potassium: 4.8 mmol/L (ref 3.5–5.1)
SGOT(AST): 38 U/L — ABNORMAL HIGH (ref 15–37)
SGPT (ALT): 29 U/L
SODIUM: 138 mmol/L (ref 136–145)
TOTAL PROTEIN: 5.7 g/dL — AB (ref 6.4–8.2)

## 2014-10-01 LAB — CBC WITH DIFFERENTIAL/PLATELET
Basophil #: 0 10*3/uL (ref 0.0–0.1)
Basophil %: 0.3 %
Eosinophil #: 0 10*3/uL (ref 0.0–0.7)
Eosinophil %: 0.4 %
HCT: 28.4 % — ABNORMAL LOW (ref 40.0–52.0)
HGB: 9.3 g/dL — ABNORMAL LOW (ref 13.0–18.0)
LYMPHS PCT: 7.3 %
Lymphocyte #: 0.5 10*3/uL — ABNORMAL LOW (ref 1.0–3.6)
MCH: 32 pg (ref 26.0–34.0)
MCHC: 32.6 g/dL (ref 32.0–36.0)
MCV: 98 fL (ref 80–100)
Monocyte #: 0.3 x10 3/mm (ref 0.2–1.0)
Monocyte %: 3.9 %
NEUTROS PCT: 88.1 %
Neutrophil #: 6 10*3/uL (ref 1.4–6.5)
Platelet: 142 10*3/uL — ABNORMAL LOW (ref 150–440)
RBC: 2.89 10*6/uL — AB (ref 4.40–5.90)
RDW: 17.3 % — AB (ref 11.5–14.5)
WBC: 6.8 10*3/uL (ref 3.8–10.6)

## 2014-10-01 LAB — RENAL FUNCTION PANEL
ALBUMIN: 1.8 g/dL — AB (ref 3.4–5.0)
ANION GAP: 13 (ref 7–16)
BUN: 70 mg/dL — ABNORMAL HIGH (ref 7–18)
Calcium, Total: 7.6 mg/dL — ABNORMAL LOW (ref 8.5–10.1)
Chloride: 94 mmol/L — ABNORMAL LOW (ref 98–107)
Co2: 28 mmol/L (ref 21–32)
Creatinine: 6.27 mg/dL — ABNORMAL HIGH (ref 0.60–1.30)
EGFR (African American): 11 — ABNORMAL LOW
GFR CALC NON AF AMER: 9 — AB
Glucose: 48 mg/dL — ABNORMAL LOW (ref 65–99)
OSMOLALITY: 288 (ref 275–301)
PHOSPHORUS: 9.4 mg/dL — AB (ref 2.5–4.9)
Potassium: 5.6 mmol/L — ABNORMAL HIGH (ref 3.5–5.1)
Sodium: 135 mmol/L — ABNORMAL LOW (ref 136–145)

## 2014-10-02 LAB — CBC WITH DIFFERENTIAL/PLATELET
Basophil #: 0 10*3/uL (ref 0.0–0.1)
Basophil %: 0 %
EOS PCT: 0 %
Eosinophil #: 0 10*3/uL (ref 0.0–0.7)
HCT: 23.4 % — ABNORMAL LOW (ref 40.0–52.0)
HGB: 7.8 g/dL — ABNORMAL LOW (ref 13.0–18.0)
Lymphocyte #: 0.4 10*3/uL — ABNORMAL LOW (ref 1.0–3.6)
Lymphocyte %: 4 %
MCH: 32.4 pg (ref 26.0–34.0)
MCHC: 33.4 g/dL (ref 32.0–36.0)
MCV: 97 fL (ref 80–100)
MONOS PCT: 3.3 %
Monocyte #: 0.3 x10 3/mm (ref 0.2–1.0)
NEUTROS ABS: 8.7 10*3/uL — AB (ref 1.4–6.5)
Neutrophil %: 92.7 %
Platelet: 121 10*3/uL — ABNORMAL LOW (ref 150–440)
RBC: 2.41 10*6/uL — ABNORMAL LOW (ref 4.40–5.90)
RDW: 17.6 % — ABNORMAL HIGH (ref 11.5–14.5)
WBC: 9.4 10*3/uL (ref 3.8–10.6)

## 2014-10-02 LAB — BASIC METABOLIC PANEL
Anion Gap: 8 (ref 7–16)
BUN: 32 mg/dL — AB (ref 7–18)
CALCIUM: 8.3 mg/dL — AB (ref 8.5–10.1)
CO2: 33 mmol/L — AB (ref 21–32)
Chloride: 99 mmol/L (ref 98–107)
Creatinine: 3.46 mg/dL — ABNORMAL HIGH (ref 0.60–1.30)
GFR CALC AF AMER: 23 — AB
GFR CALC NON AF AMER: 19 — AB
Glucose: 104 mg/dL — ABNORMAL HIGH (ref 65–99)
Osmolality: 287 (ref 275–301)
POTASSIUM: 4.2 mmol/L (ref 3.5–5.1)
Sodium: 140 mmol/L (ref 136–145)

## 2014-10-02 LAB — OCCULT BLOOD X 1 CARD TO LAB, STOOL: OCCULT BLOOD, FECES: NEGATIVE

## 2014-10-03 LAB — EXPECTORATED SPUTUM ASSESSMENT W GRAM STAIN, RFLX TO RESP C

## 2014-10-03 LAB — CBC WITH DIFFERENTIAL/PLATELET
Basophil #: 0 10*3/uL (ref 0.0–0.1)
Basophil %: 0.1 %
Eosinophil #: 0 10*3/uL (ref 0.0–0.7)
Eosinophil %: 0 %
HCT: 25.7 % — ABNORMAL LOW (ref 40.0–52.0)
HGB: 8.3 g/dL — ABNORMAL LOW (ref 13.0–18.0)
Lymphocyte #: 0.4 10*3/uL — ABNORMAL LOW (ref 1.0–3.6)
Lymphocyte %: 5 %
MCH: 31.8 pg (ref 26.0–34.0)
MCHC: 32.2 g/dL (ref 32.0–36.0)
MCV: 99 fL (ref 80–100)
MONO ABS: 0.2 x10 3/mm (ref 0.2–1.0)
Monocyte %: 3 %
Neutrophil #: 6.8 10*3/uL — ABNORMAL HIGH (ref 1.4–6.5)
Neutrophil %: 91.9 %
PLATELETS: 138 10*3/uL — AB (ref 150–440)
RBC: 2.61 10*6/uL — AB (ref 4.40–5.90)
RDW: 17.7 % — ABNORMAL HIGH (ref 11.5–14.5)
WBC: 7.5 10*3/uL (ref 3.8–10.6)

## 2014-10-03 LAB — BASIC METABOLIC PANEL
Anion Gap: 11 (ref 7–16)
BUN: 54 mg/dL — AB (ref 7–18)
CO2: 32 mmol/L (ref 21–32)
Calcium, Total: 8.2 mg/dL — ABNORMAL LOW (ref 8.5–10.1)
Chloride: 96 mmol/L — ABNORMAL LOW (ref 98–107)
Creatinine: 4.59 mg/dL — ABNORMAL HIGH (ref 0.60–1.30)
EGFR (African American): 16 — ABNORMAL LOW
GFR CALC NON AF AMER: 14 — AB
GLUCOSE: 124 mg/dL — AB (ref 65–99)
Osmolality: 294 (ref 275–301)
Potassium: 4.6 mmol/L (ref 3.5–5.1)
Sodium: 139 mmol/L (ref 136–145)

## 2014-10-04 LAB — RENAL FUNCTION PANEL
ALBUMIN: 2 g/dL — AB (ref 3.4–5.0)
Anion Gap: 11 (ref 7–16)
BUN: 76 mg/dL — ABNORMAL HIGH (ref 7–18)
CHLORIDE: 93 mmol/L — AB (ref 98–107)
Calcium, Total: 8.4 mg/dL — ABNORMAL LOW (ref 8.5–10.1)
Co2: 28 mmol/L (ref 21–32)
Creatinine: 5.54 mg/dL — ABNORMAL HIGH (ref 0.60–1.30)
EGFR (African American): 13 — ABNORMAL LOW
GFR CALC NON AF AMER: 11 — AB
Glucose: 93 mg/dL (ref 65–99)
OSMOLALITY: 287 (ref 275–301)
Phosphorus: 7.9 mg/dL — ABNORMAL HIGH (ref 2.5–4.9)
Potassium: 5.2 mmol/L — ABNORMAL HIGH (ref 3.5–5.1)
Sodium: 132 mmol/L — ABNORMAL LOW (ref 136–145)

## 2014-10-04 LAB — CBC WITH DIFFERENTIAL/PLATELET
Basophil #: 0 10*3/uL (ref 0.0–0.1)
Basophil %: 0.1 %
EOS ABS: 0 10*3/uL (ref 0.0–0.7)
EOS PCT: 0.1 %
HCT: 29.7 % — ABNORMAL LOW (ref 40.0–52.0)
HGB: 9.8 g/dL — AB (ref 13.0–18.0)
Lymphocyte #: 0.4 10*3/uL — ABNORMAL LOW (ref 1.0–3.6)
Lymphocyte %: 7.1 %
MCH: 32.1 pg (ref 26.0–34.0)
MCHC: 33.1 g/dL (ref 32.0–36.0)
MCV: 97 fL (ref 80–100)
Monocyte #: 0.1 x10 3/mm — ABNORMAL LOW (ref 0.2–1.0)
Monocyte %: 2.5 %
NEUTROS ABS: 4.8 10*3/uL (ref 1.4–6.5)
Neutrophil %: 90.2 %
Platelet: 143 10*3/uL — ABNORMAL LOW (ref 150–440)
RBC: 3.07 10*6/uL — ABNORMAL LOW (ref 4.40–5.90)
RDW: 17.4 % — ABNORMAL HIGH (ref 11.5–14.5)
WBC: 5.3 10*3/uL (ref 3.8–10.6)

## 2014-10-04 LAB — CULTURE, BLOOD (SINGLE)

## 2014-10-05 LAB — COMPREHENSIVE METABOLIC PANEL
ALBUMIN: 1.9 g/dL — AB (ref 3.4–5.0)
Alkaline Phosphatase: 107 U/L
Anion Gap: 10 (ref 7–16)
BUN: 56 mg/dL — AB (ref 7–18)
Bilirubin,Total: 0.4 mg/dL (ref 0.2–1.0)
CALCIUM: 8.3 mg/dL — AB (ref 8.5–10.1)
CHLORIDE: 95 mmol/L — AB (ref 98–107)
CO2: 32 mmol/L (ref 21–32)
CREATININE: 4.57 mg/dL — AB (ref 0.60–1.30)
EGFR (African American): 16 — ABNORMAL LOW
EGFR (Non-African Amer.): 14 — ABNORMAL LOW
Glucose: 86 mg/dL (ref 65–99)
Osmolality: 289 (ref 275–301)
Potassium: 5.4 mmol/L — ABNORMAL HIGH (ref 3.5–5.1)
SGOT(AST): 28 U/L (ref 15–37)
SGPT (ALT): 16 U/L
SODIUM: 137 mmol/L (ref 136–145)
Total Protein: 6 g/dL — ABNORMAL LOW (ref 6.4–8.2)

## 2014-10-05 LAB — CBC WITH DIFFERENTIAL/PLATELET
Bands: 5 %
HCT: 33.4 % — ABNORMAL LOW (ref 40.0–52.0)
HGB: 10.8 g/dL — ABNORMAL LOW (ref 13.0–18.0)
Lymphocytes: 21 %
MCH: 31.7 pg (ref 26.0–34.0)
MCHC: 32.4 g/dL (ref 32.0–36.0)
MCV: 98 fL (ref 80–100)
MONOS PCT: 3 %
NRBC/100 WBC: 8 /
PLATELETS: 192 10*3/uL (ref 150–440)
RBC: 3.42 10*6/uL — AB (ref 4.40–5.90)
RDW: 17.6 % — ABNORMAL HIGH (ref 11.5–14.5)
SEGMENTED NEUTROPHILS: 71 %
WBC: 4.9 10*3/uL (ref 3.8–10.6)

## 2014-10-05 LAB — TROPONIN I: TROPONIN-I: 0.19 ng/mL — AB

## 2014-10-05 LAB — PHOSPHORUS: Phosphorus: 6.6 mg/dL — ABNORMAL HIGH (ref 2.5–4.9)

## 2014-10-06 LAB — TROPONIN I
TROPONIN-I: 0.33 ng/mL — AB
Troponin-I: 0.26 ng/mL — ABNORMAL HIGH

## 2014-10-06 LAB — BASIC METABOLIC PANEL
ANION GAP: 10 (ref 7–16)
BUN: 42 mg/dL — ABNORMAL HIGH (ref 7–18)
CALCIUM: 7.8 mg/dL — AB (ref 8.5–10.1)
CO2: 31 mmol/L (ref 21–32)
CREATININE: 3.77 mg/dL — AB (ref 0.60–1.30)
Chloride: 96 mmol/L — ABNORMAL LOW (ref 98–107)
EGFR (African American): 21 — ABNORMAL LOW
EGFR (Non-African Amer.): 17 — ABNORMAL LOW
GLUCOSE: 121 mg/dL — AB (ref 65–99)
Osmolality: 286 (ref 275–301)
POTASSIUM: 4.5 mmol/L (ref 3.5–5.1)
SODIUM: 137 mmol/L (ref 136–145)

## 2014-10-07 LAB — RENAL FUNCTION PANEL
ANION GAP: 10 (ref 7–16)
Albumin: 1.4 g/dL — ABNORMAL LOW (ref 3.4–5.0)
BUN: 56 mg/dL — AB (ref 7–18)
CHLORIDE: 96 mmol/L — AB (ref 98–107)
Calcium, Total: 7.6 mg/dL — ABNORMAL LOW (ref 8.5–10.1)
Co2: 29 mmol/L (ref 21–32)
Creatinine: 5.16 mg/dL — ABNORMAL HIGH (ref 0.60–1.30)
EGFR (African American): 14 — ABNORMAL LOW
GFR CALC NON AF AMER: 12 — AB
Glucose: 75 mg/dL (ref 65–99)
Osmolality: 284 (ref 275–301)
Phosphorus: 5.9 mg/dL — ABNORMAL HIGH (ref 2.5–4.9)
Potassium: 5.2 mmol/L — ABNORMAL HIGH (ref 3.5–5.1)
SODIUM: 135 mmol/L — AB (ref 136–145)

## 2014-10-07 LAB — CK: CK, TOTAL: 58 U/L

## 2014-10-08 LAB — BASIC METABOLIC PANEL WITH GFR
Anion Gap: 11
Anion Gap: 12
Anion Gap: 9
BUN: 66 mg/dL — ABNORMAL HIGH
BUN: 73 mg/dL — ABNORMAL HIGH
BUN: 59 mg/dL — ABNORMAL HIGH
Calcium, Total: 7.4 mg/dL — ABNORMAL LOW
Calcium, Total: 7.5 mg/dL — ABNORMAL LOW
Calcium, Total: 7.9 mg/dL — ABNORMAL LOW
Chloride: 95 mmol/L — ABNORMAL LOW
Chloride: 94 mmol/L — ABNORMAL LOW
Chloride: 97 mmol/L — ABNORMAL LOW
Co2: 29 mmol/L
Co2: 29 mmol/L
Co2: 29 mmol/L
Creatinine: 5.6 mg/dL — ABNORMAL HIGH
Creatinine: 6.19 mg/dL — ABNORMAL HIGH
Creatinine: 4.61 mg/dL — ABNORMAL HIGH
EGFR (African American): 13 — ABNORMAL LOW
EGFR (African American): 12 — ABNORMAL LOW
EGFR (African American): 16 — ABNORMAL LOW
EGFR (Non-African Amer.): 11 — ABNORMAL LOW
EGFR (Non-African Amer.): 10 — ABNORMAL LOW
EGFR (Non-African Amer.): 13 — ABNORMAL LOW
Glucose: 89 mg/dL
Glucose: 83 mg/dL
Glucose: 84 mg/dL
Osmolality: 289
Osmolality: 291
Osmolality: 286
Potassium: 5.4 mmol/L — ABNORMAL HIGH
Potassium: 5.8 mmol/L — ABNORMAL HIGH
Potassium: 4.8 mmol/L
Sodium: 135 mmol/L — ABNORMAL LOW
Sodium: 135 mmol/L — ABNORMAL LOW
Sodium: 135 mmol/L — ABNORMAL LOW

## 2014-10-08 LAB — RENAL FUNCTION PANEL
Albumin: 1.3 g/dL — ABNORMAL LOW
Anion Gap: 8
BUN: 66 mg/dL — ABNORMAL HIGH
Calcium, Total: 7.8 mg/dL — ABNORMAL LOW
Chloride: 94 mmol/L — ABNORMAL LOW
Co2: 29 mmol/L
Creatinine: 5.79 mg/dL — ABNORMAL HIGH
EGFR (African American): 13 — ABNORMAL LOW
EGFR (Non-African Amer.): 10 — ABNORMAL LOW
Glucose: 96 mg/dL
Osmolality: 282
Phosphorus: 7.5 mg/dL — ABNORMAL HIGH
Potassium: 5.6 mmol/L — ABNORMAL HIGH
Sodium: 131 mmol/L — ABNORMAL LOW

## 2014-10-08 LAB — BASIC METABOLIC PANEL
ANION GAP: 10 (ref 7–16)
ANION GAP: 9 (ref 7–16)
Anion Gap: 8 (ref 7–16)
BUN: 61 mg/dL — ABNORMAL HIGH (ref 7–18)
BUN: 54 mg/dL — ABNORMAL HIGH (ref 7–18)
BUN: 50 mg/dL — AB (ref 7–18)
Calcium, Total: 7.9 mg/dL — ABNORMAL LOW (ref 8.5–10.1)
Calcium, Total: 7.7 mg/dL — ABNORMAL LOW (ref 8.5–10.1)
Calcium, Total: 7.8 mg/dL — ABNORMAL LOW (ref 8.5–10.1)
Chloride: 95 mmol/L — ABNORMAL LOW (ref 98–107)
Chloride: 98 mmol/L (ref 98–107)
Chloride: 99 mmol/L (ref 98–107)
Co2: 28 mmol/L (ref 21–32)
Co2: 29 mmol/L (ref 21–32)
Co2: 29 mmol/L (ref 21–32)
Creatinine: 5.15 mg/dL — ABNORMAL HIGH (ref 0.60–1.30)
Creatinine: 4.46 mg/dL — ABNORMAL HIGH (ref 0.60–1.30)
Creatinine: 4.1 mg/dL — ABNORMAL HIGH (ref 0.60–1.30)
EGFR (African American): 14 — ABNORMAL LOW
EGFR (African American): 17 — ABNORMAL LOW
EGFR (Non-African Amer.): 12 — ABNORMAL LOW
EGFR (Non-African Amer.): 14 — ABNORMAL LOW
EGFR (Non-African Amer.): 15 — ABNORMAL LOW
GFR CALC AF AMER: 19 — AB
GLUCOSE: 82 mg/dL (ref 65–99)
Glucose: 90 mg/dL (ref 65–99)
Glucose: 82 mg/dL (ref 65–99)
OSMOLALITY: 279 (ref 275–301)
OSMOLALITY: 288 (ref 275–301)
Osmolality: 286 (ref 275–301)
POTASSIUM: 4.9 mmol/L (ref 3.5–5.1)
POTASSIUM: 4.5 mmol/L (ref 3.5–5.1)
Potassium: 4.7 mmol/L (ref 3.5–5.1)
SODIUM: 131 mmol/L — AB (ref 136–145)
Sodium: 137 mmol/L (ref 136–145)
Sodium: 137 mmol/L (ref 136–145)

## 2014-10-08 LAB — PROTIME-INR
INR: 1.3
Prothrombin Time: 15.8 secs — ABNORMAL HIGH (ref 11.5–14.7)

## 2014-10-08 LAB — APTT: ACTIVATED PTT: 90.1 s — AB (ref 23.6–35.9)

## 2014-10-09 LAB — CBC WITH DIFFERENTIAL/PLATELET
HCT: 22.2 % — AB (ref 40.0–52.0)
HGB: 7.4 g/dL — ABNORMAL LOW (ref 13.0–18.0)
Lymphocytes: 3 %
MCH: 32 pg (ref 26.0–34.0)
MCHC: 33.1 g/dL (ref 32.0–36.0)
MCV: 97 fL (ref 80–100)
Monocytes: 4 %
NRBC/100 WBC: 5 /
Platelet: 105 10*3/uL — ABNORMAL LOW (ref 150–440)
RBC: 2.3 10*6/uL — ABNORMAL LOW (ref 4.40–5.90)
RDW: 17.7 % — ABNORMAL HIGH (ref 11.5–14.5)
SEGMENTED NEUTROPHILS: 93 %
WBC: 6.1 10*3/uL (ref 3.8–10.6)

## 2014-10-09 LAB — BASIC METABOLIC PANEL
ANION GAP: 8 (ref 7–16)
ANION GAP: 7 (ref 7–16)
Anion Gap: 8 (ref 7–16)
Anion Gap: 7 (ref 7–16)
Anion Gap: 6 — ABNORMAL LOW (ref 7–16)
Anion Gap: 5 — ABNORMAL LOW (ref 7–16)
BUN: 41 mg/dL — ABNORMAL HIGH (ref 7–18)
BUN: 35 mg/dL — AB (ref 7–18)
BUN: 27 mg/dL — ABNORMAL HIGH (ref 7–18)
BUN: 23 mg/dL — ABNORMAL HIGH (ref 7–18)
BUN: 20 mg/dL — ABNORMAL HIGH (ref 7–18)
BUN: 16 mg/dL (ref 7–18)
CALCIUM: 8.2 mg/dL — AB (ref 8.5–10.1)
CALCIUM: 8 mg/dL — AB (ref 8.5–10.1)
CALCIUM: 7.9 mg/dL — AB (ref 8.5–10.1)
CHLORIDE: 101 mmol/L (ref 98–107)
CHLORIDE: 102 mmol/L (ref 98–107)
CO2: 30 mmol/L (ref 21–32)
CO2: 29 mmol/L (ref 21–32)
CO2: 31 mmol/L (ref 21–32)
Calcium, Total: 7.9 mg/dL — ABNORMAL LOW (ref 8.5–10.1)
Calcium, Total: 8.1 mg/dL — ABNORMAL LOW (ref 8.5–10.1)
Calcium, Total: 8 mg/dL — ABNORMAL LOW (ref 8.5–10.1)
Chloride: 103 mmol/L (ref 98–107)
Chloride: 104 mmol/L (ref 98–107)
Chloride: 105 mmol/L (ref 98–107)
Chloride: 105 mmol/L (ref 98–107)
Co2: 30 mmol/L (ref 21–32)
Co2: 31 mmol/L (ref 21–32)
Co2: 31 mmol/L (ref 21–32)
Creatinine: 3.41 mg/dL — ABNORMAL HIGH (ref 0.60–1.30)
Creatinine: 2.84 mg/dL — ABNORMAL HIGH (ref 0.60–1.30)
Creatinine: 2.51 mg/dL — ABNORMAL HIGH (ref 0.60–1.30)
Creatinine: 2.02 mg/dL — ABNORMAL HIGH (ref 0.60–1.30)
Creatinine: 1.76 mg/dL — ABNORMAL HIGH (ref 0.60–1.30)
Creatinine: 1.44 mg/dL — ABNORMAL HIGH (ref 0.60–1.30)
EGFR (African American): 33 — ABNORMAL LOW
EGFR (Non-African Amer.): 19 — ABNORMAL LOW
EGFR (Non-African Amer.): 35 — ABNORMAL LOW
GFR CALC AF AMER: 23 — AB
GFR CALC AF AMER: 29 — AB
GFR CALC AF AMER: 42 — AB
GFR CALC AF AMER: 50 — AB
GFR CALC NON AF AMER: 24 — AB
GFR CALC NON AF AMER: 27 — AB
GFR CALC NON AF AMER: 41 — AB
GFR CALC NON AF AMER: 52 — AB
GLUCOSE: 78 mg/dL (ref 65–99)
GLUCOSE: 83 mg/dL (ref 65–99)
GLUCOSE: 76 mg/dL (ref 65–99)
GLUCOSE: 102 mg/dL — AB (ref 65–99)
Glucose: 75 mg/dL (ref 65–99)
Glucose: 116 mg/dL — ABNORMAL HIGH (ref 65–99)
OSMOLALITY: 286 (ref 275–301)
OSMOLALITY: 286 (ref 275–301)
OSMOLALITY: 283 (ref 275–301)
Osmolality: 287 (ref 275–301)
Osmolality: 282 (ref 275–301)
Osmolality: 286 (ref 275–301)
POTASSIUM: 4.1 mmol/L (ref 3.5–5.1)
POTASSIUM: 3.9 mmol/L (ref 3.5–5.1)
POTASSIUM: 3.9 mmol/L (ref 3.5–5.1)
Potassium: 3.9 mmol/L (ref 3.5–5.1)
Potassium: 3.7 mmol/L (ref 3.5–5.1)
Potassium: 3.9 mmol/L (ref 3.5–5.1)
SODIUM: 141 mmol/L (ref 136–145)
SODIUM: 142 mmol/L (ref 136–145)
Sodium: 139 mmol/L (ref 136–145)
Sodium: 140 mmol/L (ref 136–145)
Sodium: 140 mmol/L (ref 136–145)
Sodium: 141 mmol/L (ref 136–145)

## 2014-10-09 LAB — MAGNESIUM: Magnesium: 1.6 mg/dL — ABNORMAL LOW

## 2014-10-09 LAB — PHOSPHORUS: PHOSPHORUS: 4.6 mg/dL (ref 2.5–4.9)

## 2014-10-10 LAB — BASIC METABOLIC PANEL
ANION GAP: 4 — AB (ref 7–16)
ANION GAP: 5 — AB (ref 7–16)
Anion Gap: 2 — ABNORMAL LOW (ref 7–16)
BUN: 14 mg/dL (ref 7–18)
BUN: 12 mg/dL (ref 7–18)
BUN: 10 mg/dL (ref 7–18)
CALCIUM: 8.5 mg/dL (ref 8.5–10.1)
CALCIUM: 8.7 mg/dL (ref 8.5–10.1)
CHLORIDE: 106 mmol/L (ref 98–107)
CO2: 31 mmol/L (ref 21–32)
Calcium, Total: 8.4 mg/dL — ABNORMAL LOW (ref 8.5–10.1)
Chloride: 106 mmol/L (ref 98–107)
Chloride: 106 mmol/L (ref 98–107)
Co2: 34 mmol/L — ABNORMAL HIGH (ref 21–32)
Co2: 30 mmol/L (ref 21–32)
Creatinine: 1.24 mg/dL (ref 0.60–1.30)
Creatinine: 1.17 mg/dL (ref 0.60–1.30)
Creatinine: 1.12 mg/dL (ref 0.60–1.30)
EGFR (African American): >60
EGFR (Non-African Amer.): >60
EGFR (Non-African Amer.): >60
EGFR (Non-African Amer.): >60
GLUCOSE: 99 mg/dL (ref 65–99)
Glucose: 101 mg/dL — ABNORMAL HIGH (ref 65–99)
Glucose: 98 mg/dL (ref 65–99)
Osmolality: 284 (ref 275–301)
Osmolality: 281 (ref 275–301)
Osmolality: 280 (ref 275–301)
Potassium: 3.9 mmol/L (ref 3.5–5.1)
Potassium: 3.8 mmol/L (ref 3.5–5.1)
Potassium: 4 mmol/L (ref 3.5–5.1)
SODIUM: 142 mmol/L (ref 136–145)
Sodium: 141 mmol/L (ref 136–145)
Sodium: 141 mmol/L (ref 136–145)

## 2014-10-10 LAB — PHOSPHORUS: PHOSPHORUS: 1.6 mg/dL — AB (ref 2.5–4.9)

## 2014-10-10 LAB — HEMOGLOBIN: HGB: 7.8 g/dL — ABNORMAL LOW (ref 13.0–18.0)

## 2014-10-10 LAB — CULTURE, BLOOD (SINGLE)

## 2014-10-10 LAB — VANCOMYCIN, TROUGH: VANCOMYCIN, TROUGH: 18 ug/mL (ref 10–20)

## 2014-10-10 LAB — FERRITIN: Ferritin (ARMC): 5782 ng/mL — ABNORMAL HIGH (ref 8–388)

## 2014-10-10 LAB — IRON AND TIBC
Iron Bind.Cap.(Total): 68 ug/dL — ABNORMAL LOW (ref 250–450)
Iron Saturation: 44 %
Iron: 30 ug/dL — ABNORMAL LOW (ref 65–175)
Unbound Iron-Bind.Cap.: 38 ug/dL

## 2014-10-10 LAB — MAGNESIUM: Magnesium: 1.9 mg/dL

## 2014-10-11 LAB — PHOSPHORUS: PHOSPHORUS: 2.5 mg/dL (ref 2.5–4.9)

## 2014-10-11 LAB — BASIC METABOLIC PANEL
ANION GAP: 6 — AB (ref 7–16)
BUN: 17 mg/dL (ref 7–18)
CALCIUM: 7.8 mg/dL — AB (ref 8.5–10.1)
CO2: 30 mmol/L (ref 21–32)
Chloride: 109 mmol/L — ABNORMAL HIGH (ref 98–107)
Creatinine: 1.68 mg/dL — ABNORMAL HIGH (ref 0.60–1.30)
EGFR (Non-African Amer.): 43 — ABNORMAL LOW
GFR CALC AF AMER: 52 — AB
GLUCOSE: 122 mg/dL — AB (ref 65–99)
OSMOLALITY: 292 (ref 275–301)
Potassium: 3.8 mmol/L (ref 3.5–5.1)
SODIUM: 145 mmol/L (ref 136–145)

## 2014-10-11 LAB — HEMOGLOBIN: HGB: 7 g/dL — AB (ref 13.0–18.0)

## 2014-10-12 LAB — CULTURE, BLOOD (SINGLE)

## 2014-10-12 LAB — BODY FLUID CULTURE

## 2014-10-14 LAB — WOUND CULTURE

## 2014-10-17 LAB — EXPECTORATED SPUTUM ASSESSMENT W GRAM STAIN, RFLX TO RESP C

## 2014-10-26 ENCOUNTER — Ambulatory Visit: Payer: Self-pay | Admitting: Internal Medicine

## 2014-12-08 ENCOUNTER — Inpatient Hospital Stay
Admission: AD | Admit: 2014-12-08 | Discharge: 2014-12-27 | Disposition: E | Payer: Self-pay | Source: Ambulatory Visit | Attending: Internal Medicine | Admitting: Internal Medicine

## 2014-12-08 DIAGNOSIS — G9389 Other specified disorders of brain: Secondary | ICD-10-CM

## 2014-12-08 DIAGNOSIS — J9690 Respiratory failure, unspecified, unspecified whether with hypoxia or hypercapnia: Secondary | ICD-10-CM

## 2014-12-09 ENCOUNTER — Other Ambulatory Visit (HOSPITAL_COMMUNITY): Payer: Self-pay

## 2014-12-09 LAB — CBC
HCT: 29.5 % — ABNORMAL LOW (ref 39.0–52.0)
Hemoglobin: 9.4 g/dL — ABNORMAL LOW (ref 13.0–17.0)
MCH: 30.6 pg (ref 26.0–34.0)
MCHC: 31.9 g/dL (ref 30.0–36.0)
MCV: 96.1 fL (ref 78.0–100.0)
PLATELETS: 241 10*3/uL (ref 150–400)
RBC: 3.07 MIL/uL — ABNORMAL LOW (ref 4.22–5.81)
RDW: 22.9 % — AB (ref 11.5–15.5)
WBC: 9 10*3/uL (ref 4.0–10.5)

## 2014-12-09 LAB — RENAL FUNCTION PANEL
ALBUMIN: 2 g/dL — AB (ref 3.5–5.2)
Anion gap: 11 (ref 5–15)
BUN: 128 mg/dL — ABNORMAL HIGH (ref 6–23)
CALCIUM: 9.6 mg/dL (ref 8.4–10.5)
CO2: 26 mmol/L (ref 19–32)
CREATININE: 1.98 mg/dL — AB (ref 0.50–1.35)
Chloride: 101 mEq/L (ref 96–112)
GFR calc non Af Amer: 32 mL/min — ABNORMAL LOW (ref >90)
GFR, EST AFRICAN AMERICAN: 38 mL/min — AB (ref >90)
Glucose, Bld: 82 mg/dL (ref 70–99)
POTASSIUM: 3.9 mmol/L (ref 3.5–5.1)
Phosphorus: 4.8 mg/dL — ABNORMAL HIGH (ref 2.3–4.6)
SODIUM: 138 mmol/L (ref 135–145)

## 2014-12-09 LAB — MAGNESIUM: Magnesium: 2.4 mg/dL (ref 1.5–2.5)

## 2014-12-09 LAB — TOBRAMYCIN LEVEL, RANDOM: TOBRAMYCIN RM: 6.8 ug/mL

## 2014-12-09 LAB — TRIGLYCERIDES: Triglycerides: 170 mg/dL — ABNORMAL HIGH (ref <150)

## 2014-12-09 LAB — HEPATIC FUNCTION PANEL
ALT: 31 U/L (ref 0–53)
AST: 32 U/L (ref 0–37)
Albumin: 2 g/dL — ABNORMAL LOW (ref 3.5–5.2)
Alkaline Phosphatase: 375 U/L — ABNORMAL HIGH (ref 39–117)
BILIRUBIN DIRECT: 0.2 mg/dL (ref 0.0–0.3)
BILIRUBIN INDIRECT: 0.4 mg/dL (ref 0.3–0.9)
BILIRUBIN TOTAL: 0.6 mg/dL (ref 0.3–1.2)
Total Protein: 6.1 g/dL (ref 6.0–8.3)

## 2014-12-09 LAB — VANCOMYCIN, RANDOM: Vancomycin Rm: 40.8 ug/mL

## 2014-12-10 LAB — COMPREHENSIVE METABOLIC PANEL
ALBUMIN: 2.1 g/dL — AB (ref 3.5–5.2)
ALT: 33 U/L (ref 0–53)
AST: 33 U/L (ref 0–37)
Alkaline Phosphatase: 351 U/L — ABNORMAL HIGH (ref 39–117)
Anion gap: 19 — ABNORMAL HIGH (ref 5–15)
BUN: 156 mg/dL — ABNORMAL HIGH (ref 6–23)
CO2: 19 mmol/L (ref 19–32)
CREATININE: 2.22 mg/dL — AB (ref 0.50–1.35)
Calcium: 9.8 mg/dL (ref 8.4–10.5)
Chloride: 100 mEq/L (ref 96–112)
GFR calc Af Amer: 33 mL/min — ABNORMAL LOW (ref >90)
GFR calc non Af Amer: 28 mL/min — ABNORMAL LOW (ref >90)
Glucose, Bld: 163 mg/dL — ABNORMAL HIGH (ref 70–99)
Potassium: 4.5 mmol/L (ref 3.5–5.1)
Sodium: 138 mmol/L (ref 135–145)
Total Bilirubin: 0.6 mg/dL (ref 0.3–1.2)
Total Protein: 6.1 g/dL (ref 6.0–8.3)

## 2014-12-10 LAB — TSH: TSH: 2.209 u[IU]/mL (ref 0.350–4.500)

## 2014-12-10 LAB — MAGNESIUM: Magnesium: 2.6 mg/dL — ABNORMAL HIGH (ref 1.5–2.5)

## 2014-12-10 LAB — CBC
HCT: 31.9 % — ABNORMAL LOW (ref 39.0–52.0)
HEMOGLOBIN: 10 g/dL — AB (ref 13.0–17.0)
MCH: 31 pg (ref 26.0–34.0)
MCHC: 31.3 g/dL (ref 30.0–36.0)
MCV: 98.8 fL (ref 78.0–100.0)
Platelets: 262 10*3/uL (ref 150–400)
RBC: 3.23 MIL/uL — ABNORMAL LOW (ref 4.22–5.81)
RDW: 22.9 % — AB (ref 11.5–15.5)
WBC: 9.4 10*3/uL (ref 4.0–10.5)

## 2014-12-10 LAB — TOBRAMYCIN LEVEL, RANDOM: Tobramycin Rm: 5.9 ug/mL

## 2014-12-10 LAB — RENAL FUNCTION PANEL
ANION GAP: 13 (ref 5–15)
Albumin: 2.2 g/dL — ABNORMAL LOW (ref 3.5–5.2)
BUN: 151 mg/dL — AB (ref 6–23)
CHLORIDE: 101 meq/L (ref 96–112)
CO2: 22 mmol/L (ref 19–32)
Calcium: 9.5 mg/dL (ref 8.4–10.5)
Creatinine, Ser: 2.22 mg/dL — ABNORMAL HIGH (ref 0.50–1.35)
GFR calc Af Amer: 33 mL/min — ABNORMAL LOW (ref >90)
GFR calc non Af Amer: 28 mL/min — ABNORMAL LOW (ref >90)
GLUCOSE: 112 mg/dL — AB (ref 70–99)
PHOSPHORUS: 6.1 mg/dL — AB (ref 2.3–4.6)
Potassium: 4.4 mmol/L (ref 3.5–5.1)
SODIUM: 136 mmol/L (ref 135–145)

## 2014-12-11 DIAGNOSIS — M7989 Other specified soft tissue disorders: Secondary | ICD-10-CM

## 2014-12-11 LAB — COMPREHENSIVE METABOLIC PANEL
ALT: 34 U/L (ref 0–53)
ANION GAP: 10 (ref 5–15)
AST: 39 U/L — ABNORMAL HIGH (ref 0–37)
Albumin: 2.2 g/dL — ABNORMAL LOW (ref 3.5–5.2)
Alkaline Phosphatase: 420 U/L — ABNORMAL HIGH (ref 39–117)
BUN: 89 mg/dL — ABNORMAL HIGH (ref 6–23)
CALCIUM: 9.3 mg/dL (ref 8.4–10.5)
CO2: 26 mmol/L (ref 19–32)
Chloride: 102 mEq/L (ref 96–112)
Creatinine, Ser: 1.58 mg/dL — ABNORMAL HIGH (ref 0.50–1.35)
GFR calc Af Amer: 49 mL/min — ABNORMAL LOW (ref >90)
GFR calc non Af Amer: 43 mL/min — ABNORMAL LOW (ref >90)
Glucose, Bld: 126 mg/dL — ABNORMAL HIGH (ref 70–99)
Potassium: 3.9 mmol/L (ref 3.5–5.1)
SODIUM: 138 mmol/L (ref 135–145)
Total Bilirubin: 0.7 mg/dL (ref 0.3–1.2)
Total Protein: 6.1 g/dL (ref 6.0–8.3)

## 2014-12-11 LAB — CBC
HEMATOCRIT: 29.4 % — AB (ref 39.0–52.0)
Hemoglobin: 9.1 g/dL — ABNORMAL LOW (ref 13.0–17.0)
MCH: 30.3 pg (ref 26.0–34.0)
MCHC: 31 g/dL (ref 30.0–36.0)
MCV: 98 fL (ref 78.0–100.0)
PLATELETS: 221 10*3/uL (ref 150–400)
RBC: 3 MIL/uL — AB (ref 4.22–5.81)
RDW: 23.4 % — AB (ref 11.5–15.5)
WBC: 10.5 10*3/uL (ref 4.0–10.5)

## 2014-12-11 LAB — CORTISOL: CORTISOL PLASMA: 12.9 ug/dL

## 2014-12-11 LAB — MAGNESIUM: Magnesium: 2.4 mg/dL (ref 1.5–2.5)

## 2014-12-11 LAB — T4, FREE: Free T4: 1.35 ng/dL (ref 0.80–1.80)

## 2014-12-11 LAB — HEPATITIS B CORE ANTIBODY, IGM: Hep B C IgM: NONREACTIVE

## 2014-12-11 LAB — HEPATITIS B SURFACE ANTIBODY, QUANTITATIVE: Hepatitis B-Post: 295 m[IU]/mL (ref >9.9)

## 2014-12-11 LAB — HEPATITIS C ANTIBODY: HCV Ab: NEGATIVE

## 2014-12-11 LAB — PHOSPHORUS: Phosphorus: 4 mg/dL (ref 2.3–4.6)

## 2014-12-11 NOTE — Progress Notes (Addendum)
VASCULAR LAB PRELIMINARY  PRELIMINARY  PRELIMINARY  PRELIMINARY  Right upper extremity venous Doppler completed.    Preliminary report:  There is no obvious evidence of DVT or SVT noted in the right upper extremity.  Dialysis access is patent.  Interstitial fluid noted throughout. Technically limited study secondary to trach collar.   Kaleya Douse, RVT 12/11/2014, 3:09 PM

## 2014-12-12 ENCOUNTER — Other Ambulatory Visit (HOSPITAL_COMMUNITY): Payer: Medicare Other

## 2014-12-12 ENCOUNTER — Other Ambulatory Visit (HOSPITAL_COMMUNITY): Payer: Self-pay

## 2014-12-12 LAB — RENAL FUNCTION PANEL
Albumin: 2.4 g/dL — ABNORMAL LOW (ref 3.5–5.2)
Anion gap: 16 — ABNORMAL HIGH (ref 5–15)
BUN: 129 mg/dL — ABNORMAL HIGH (ref 6–23)
CALCIUM: 9.7 mg/dL (ref 8.4–10.5)
CO2: 20 mmol/L (ref 19–32)
CREATININE: 1.95 mg/dL — AB (ref 0.50–1.35)
Chloride: 102 mEq/L (ref 96–112)
GFR calc non Af Amer: 33 mL/min — ABNORMAL LOW (ref >90)
GFR, EST AFRICAN AMERICAN: 38 mL/min — AB (ref >90)
GLUCOSE: 119 mg/dL — AB (ref 70–99)
Phosphorus: 5 mg/dL — ABNORMAL HIGH (ref 2.3–4.6)
Potassium: 4.6 mmol/L (ref 3.5–5.1)
Sodium: 138 mmol/L (ref 135–145)

## 2014-12-12 LAB — TOBRAMYCIN LEVEL, RANDOM: Tobramycin Rm: 3.8 ug/mL

## 2014-12-12 LAB — MAGNESIUM: Magnesium: 2.6 mg/dL — ABNORMAL HIGH (ref 1.5–2.5)

## 2014-12-13 LAB — COMPREHENSIVE METABOLIC PANEL
ALK PHOS: 533 U/L — AB (ref 39–117)
ALT: 37 U/L (ref 0–53)
ANION GAP: 11 (ref 5–15)
AST: 41 U/L — ABNORMAL HIGH (ref 0–37)
Albumin: 2.2 g/dL — ABNORMAL LOW (ref 3.5–5.2)
BILIRUBIN TOTAL: 0.6 mg/dL (ref 0.3–1.2)
BUN: 70 mg/dL — AB (ref 6–23)
CALCIUM: 9.1 mg/dL (ref 8.4–10.5)
CO2: 26 mmol/L (ref 19–32)
Chloride: 102 mEq/L (ref 96–112)
Creatinine, Ser: 1.35 mg/dL (ref 0.50–1.35)
GFR calc Af Amer: 60 mL/min — ABNORMAL LOW (ref >90)
GFR calc non Af Amer: 52 mL/min — ABNORMAL LOW (ref >90)
GLUCOSE: 129 mg/dL — AB (ref 70–99)
POTASSIUM: 4.2 mmol/L (ref 3.5–5.1)
Sodium: 139 mmol/L (ref 135–145)
Total Protein: 5.9 g/dL — ABNORMAL LOW (ref 6.0–8.3)

## 2014-12-13 LAB — HEPATITIS B SURFACE ANTIGEN: HEP B S AG: NEGATIVE

## 2014-12-13 LAB — RENAL FUNCTION PANEL
ALBUMIN: 2.1 g/dL — AB (ref 3.5–5.2)
Anion gap: 10 (ref 5–15)
BUN: 159 mg/dL — AB (ref 6–23)
CALCIUM: 9.6 mg/dL (ref 8.4–10.5)
CO2: 25 mmol/L (ref 19–32)
Chloride: 101 mEq/L (ref 96–112)
Creatinine, Ser: 2.2 mg/dL — ABNORMAL HIGH (ref 0.50–1.35)
GFR calc non Af Amer: 29 mL/min — ABNORMAL LOW (ref >90)
GFR, EST AFRICAN AMERICAN: 33 mL/min — AB (ref >90)
Glucose, Bld: 115 mg/dL — ABNORMAL HIGH (ref 70–99)
PHOSPHORUS: 6.1 mg/dL — AB (ref 2.3–4.6)
POTASSIUM: 5.2 mmol/L — AB (ref 3.5–5.1)
SODIUM: 136 mmol/L (ref 135–145)

## 2014-12-13 LAB — HEPATITIS B CORE ANTIBODY, TOTAL: Hep B Core Total Ab: NONREACTIVE

## 2014-12-13 LAB — MAGNESIUM
MAGNESIUM: 2.6 mg/dL — AB (ref 1.5–2.5)
MAGNESIUM: 2.2 mg/dL (ref 1.5–2.5)

## 2014-12-13 LAB — TRIGLYCERIDES: Triglycerides: 51 mg/dL (ref <150)

## 2014-12-13 LAB — PHOSPHORUS: Phosphorus: 3.5 mg/dL (ref 2.3–4.6)

## 2014-12-13 LAB — TOBRAMYCIN LEVEL, RANDOM: Tobramycin Rm: 3.5 ug/mL

## 2014-12-14 LAB — HEPATITIS B SURFACE ANTIBODY,QUALITATIVE: HEP B S AB: REACTIVE — AB

## 2014-12-17 LAB — CULTURE, BLOOD (ROUTINE X 2)
CULTURE: NO GROWTH
Culture: NO GROWTH

## 2014-12-18 LAB — CULTURE, RESPIRATORY

## 2014-12-18 LAB — CULTURE, RESPIRATORY W GRAM STAIN

## 2014-12-19 LAB — CULTURE, BLOOD (ROUTINE X 2)
Culture: NO GROWTH
Culture: NO GROWTH

## 2014-12-20 ENCOUNTER — Ambulatory Visit: Payer: Medicare Other | Admitting: Neurology

## 2014-12-27 DEATH — deceased

## 2015-03-19 NOTE — Op Note (Signed)
PATIENT NAME:  Christopher [name], Christopher [name] MR#:  [id number] DATE OF BIRTH:  July 26, 1946  DATE OF PROCEDURE:  10/06/2014  PREOPERATIVE DIAGNOSES:  1.  Right femoral deep venous thrombosis and contraindication to anticoagulation secondary to intra-abdominal infection.  2.  End-stage renal disease.  3.  Right innominate vein occlusion.  4.  Poorly functioning right arm arteriovenous fistula with requirement of placing left jugular temporary dialysis catheter.  5.  Previous right femoral central line placement.   POSTOPERATIVE DIAGNOSES: 1.  Right femoral deep venous thrombosis and contraindication to anticoagulation secondary to intra-abdominal infection.  2.  End-stage renal disease.  3.  Right innominate vein occlusion.  4.  Poorly functioning right arm arteriovenous fistula with requirement of placing left jugular temporary dialysis catheter.  5.  Previous right femoral central line placement.   PROCEDURES: 1.  Ultrasound guidance for vascular access, left femoral vein.  2.  Placement of inferior vena cava filter.  3.  Fluoroscopic guidance placement of a central line.  4.  Placement of left femoral triple lumen catheter.   SURGEON:  Dr. Lucky Cowboy.   ANESTHESIA:  Local.   ESTIMATED BLOOD LOSS:  Minimal.   INDICATION FOR PROCEDURE:  A 69 year old gentleman who is critically ill in the critical care unit. He has a Scientist, product/process development of an ongoing issues. He has an intra-abdominal infection, spiking fevers. He has had a right femoral central line placed about a week ago and now has a right femoral DVT. This line will need to be removed. He cannot have anticoagulation due to his intra-abdominal process. He has left jugular temporary dialysis catheter. He has a right jugular innominate vein occlusion, that could not be crossed on a previous fistulogram. We now need to place an IVC filter secondary to his DVT and inability to anticoagulate him. In addition to this, he needs durable venous access and his right femoral  triple-lumen need to be removed. We will leave a central line for the left femoral access.   DESCRIPTION OF PROCEDURE:  The patient is brought to the vascular suite. Left groin was sterilely prepped and draped and a sterile surgical field was created. The left femoral vein is small, but patent. It was then accessed under direct ultrasound guidance without difficulty with a Seldinger needle. A J-wire was then placed. After skin nick and dilatation, the delivery sheath was placed into the inferior vena cava. An inferior venacavogram was performed. This showed a patent IVC. I then placed the IVC filter at about L2 below the renal veins. The delivery sheath was removed but a wire was replaced. I then used fluoroscopic guidance to placed a triple lumen catheter over the wire. The catheter tip advanced to the left common iliac vein just before the iliac confluence. At this location, it was about 20 cm at the skin.  It withdrew blood well and flushed easily with heparinized saline. It was secured to the skin with 3 silk sutures and a sterile dressing was placed. The patient tolerated the procedure well.    ____________________________ Christopher Huxley, MD jsd:lt D: 10/06/2014 17:53:09 ET T: 10/06/2014 21:43:35 ET JOB#: [id number]  cc: Christopher Huxley, MD, <Dictator> Christopher Huxley MD ELECTRONICALLY SIGNED 10/28/2014 9:16

## 2015-03-19 NOTE — Op Note (Signed)
PATIENT NAME:  Christopher [name], Christopher [name] MR#:  [id number] DATE OF BIRTH:  1946-08-20  DATE OF PROCEDURE:  09/29/2014  PREOPERATIVE DIAGNOSIS: Perforated diverticulitis.   POSTOPERATIVE DIAGNOSES:  1.  Perforated diverticulitis.  2.  Lack of intravenous access.   ANESTHESIA: Local.    PROCEDURE: Right common femoral line insertion using ultrasound guidance,   SURGEON: Dia Crawford, MD.     OPERATIVE PROCEDURE: With the patient in the supine position, his right groin was prepped with ChloraPrep and draped with sterile towels. The groin was interrogated with the ultrasound, the common femoral vein was identified. The vein was cannulated on the second pass and a wire passed into the great vessel system without difficulty. The dilator was inserted over the wire and then the dilator removed and the triple-lumen catheter inserted over the wire. The catheter was threaded freely without any particular problems. Catheter was flushed and capped, secured with silk sutures and sterilely dressed.    ____________________________ Rodena Goldmann III, MD rle:bu D: 09/29/2014 08:07:00 ET T: 09/29/2014 14:30:01 ET JOB#: [id number]  cc: Micheline Maze, MD, <Dictator> Rodena Goldmann MD ELECTRONICALLY SIGNED 10/06/2014 13:48

## 2015-03-19 NOTE — Consult Note (Signed)
PATIENT NAME:  Christopher [name], Christopher [name] MR#:  [id number] DATE OF BIRTH:  [birth year]  DATE OF CONSULTATION:  09/29/2014  REFERRING PHYSICIAN:  Harrell Gave A. Lundquist, MD CONSULTING PHYSICIAN:  Corey Skains, MD  REASON FOR CONSULTATION: Elevated troponin with hypotension.   CHIEF COMPLAINT: The patient is somewhat obtunded and not conversant.   HISTORY OF PRESENT ILLNESS:  This is a 69 year old male with end-stage renal disease, chronic kidney disease, Stage V, with dialysis and then shunt who receives dialysis 3 times per week. The patient has had essential hypertension as well, although currently has come in with abdominal discomfort and a fever consistent with possible infection. The patient has had low blood pressure with a 70 systolic and a heart rate of 800 beats per minute consistent with infection. The patient has had intravenous fluids, which have helped somewhat, but now is needing further treatment. He does have an elevated troponin of 0.45 consistent with demand ischemia and no current evidence of acute coronary syndrome. He has had his medications held for high blood pressure. He does have mixed hyperlipidemia, stable on atorvastatin. The patient has also had antibiotics at this time and oxygenation.  REVIEW OF SYSTEMS:   Cannot be assessed due to sort of obtundation of the patient.   PAST MEDICAL HISTORY: 1.  Chronic kidney disease, Stage V. 2.  Essential hypertension,  3.  Mixed hyperlipidemia.   FAMILY HISTORY: No apparent family members with early onset of cardiovascular disease but positive for hypertension.   SOCIAL HISTORY: Currently apparently denies alcohol or tobacco use.   ALLERGIES: As listed.   MEDICATIONS: As listed.   PHYSICAL EXAMINATION: VITAL SIGNS: Blood pressure is 70/40, heart rate is 100 and regular.  GENERAL: He is a well appearing male in no acute distress.  HEENT: No icterus, thyromegaly, ulcers, hemorrhage, or xanthelasma.  CARDIOVASCULAR: Rapid rate  with inferiorly displaced PMI with no apparent murmurs. Carotid upstroke normal without bruit. Jugular venous pressure is normal.  LUNGS: Have diffuse rhonchi and few rales. ABDOMEN: Soft, nontender. Cannot assess hepatosplenomegaly or masses, but does have some increased abdominal girth.  EXTREMITIES: Show 2+ upper extremity and shunting with catheter as well and 1+ lower extremity edema. No cyanosis, clubbing or ulcers.  NEUROLOGIC: He is obtunded at this time and not oriented to time, place, and person.   ASSESSMENT: A 69 year old male with chronic kidney disease, Stage V, essential hypertension, and mixed hyperlipidemia with significant hypotension likely due to sepsis and/or other infection needing further treatment.   RECOMMENDATIONS: 1.  Continue serial ECG and enzymes to assess extent of possible myocardial infarction.  2.  Echocardiogram for LV systolic dysfunction or valvular heart disease contributing to above. 3.  Hydration for hypotension. 4.  Dopamine 5 to 10 mcg for better blood pressure control and watch for significant tachycardia.  5.  Antibiotics possible abdominal infection.  6.  Further consideration of dialysis for pulmonary edema and rhonchi, if necessary.  7.  Further treatment options after above.      ____________________________ Corey Skains, MD bjk:jp D: 09/29/2014 07:25:01 ET T: 09/29/2014 09:21:05 ET JOB#: [id number]  cc: Corey Skains, MD, <Dictator> Corey Skains MD ELECTRONICALLY SIGNED 10/01/2014 7:52

## 2015-03-19 NOTE — Op Note (Signed)
PATIENT NAME:  Christopher [name], Christopher [name] MR#:  [id number] DATE OF BIRTH:  24-Jul-1946  DATE OF PROCEDURE:  10/01/2014  PREOPERATIVE DIAGNOSES:  1.  Complication of dialysis device with inability to cannulate.  2.  End-stage renal disease requiring hemodialysis.  3.  Congestive heart failure secondary to volume overload.   4.  Hypotension.   POSTOPERATIVE DIAGNOSES: 1.  Complication of dialysis device with inability to cannulate.  2.  End-stage renal disease requiring hemodialysis.  3.  Congestive heart failure secondary to volume overload.   4.  Hypotension.   PROCEDURE PERFORMED: Insertion of left IJ double-lumen dialysis catheter, temporary type.   SURGEON: Katha Cabal, MD   DESCRIPTION OF PROCEDURE: The patient is in the Intensive Care Unit. He is critically ill. He is unable to get his dialysis secondary to inability to cannulate. I have therefore been asked to secure access. Risks and benefits were reviewed and the patient has agreed for me to proceed.   Ultrasound is then used to inspect the right neck and demonstrates the jugular vein occludes at the level of the clavicle. On the left side, jugular vein appears patent. The left neck is then prepped and draped in a sterile fashion. Lidocaine 1% is infiltrated in the soft tissues and subsequently the microneedle is inserted. Initially, there was not an arterial stick and the needle was withdrawn and pressure was held for several minutes. On the second pass, the jugular vein is accessed without further difficulty. Microwire is advanced, followed by microsheath, J-wire, followed by the dilator, and then the double-lumen catheter. Both lumens aspirate and flush easily and sterile caps are applied. The catheter is secured to the skin of the neck with 2-0 silk. Sterile dressing is applied.   The patient tolerated the procedure well and there were no immediate complications.      ____________________________ Katha Cabal,  MD ggs:ts D: 10/01/2014 17:12:14 ET T: 10/02/2014 02:14:17 ET JOB#: [id number]  cc: Katha Cabal, MD, <Dictator> Katha Cabal MD ELECTRONICALLY SIGNED 10/06/2014 8:00

## 2015-03-19 NOTE — Op Note (Signed)
PATIENT NAME:  Christopher [name], Christopher [name] MR#:  [id number] DATE OF BIRTH:  [birth year]  DATE OF PROCEDURE:  10/04/2014  PREOPERATIVE DIAGNOSES:  1.  End-stage renal disease.  2.  Poorly functioning right arm arteriovenous fistula.   POSTOPERATIVE DIAGNOSES: 1.  End-stage renal disease.  2.  Poorly functioning right arm arteriovenous fistula.   PROCEDURES:  1.  Ultrasound guidance for vascular access to right arm arteriovenous fistula.  2.  Right upper extremity fistulogram and central venogram.  3.  Percutaneous transluminal angioplasty of upper arm cephalic vein from the antecubital fossa to the subclavian vein with 6 mm diameter angioplasty balloon. 4.  Percutaneous transluminal angioplasty of median antecubital vein and basilic vein with 7 mm diameter angioplasty balloon.  5.  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   SURGEON: Algernon Huxley, MD   ANESTHESIA: Local with moderate conscious sedation.   ESTIMATED BLOOD LOSS: Approximately 25 mL.   INDICATION FOR PROCEDURE: A 69 year old gentleman with end-stage renal disease. They have been unable to use his right arm AV fistula,  his right arm is significantly swollen. He is brought in for fistulogram for further evaluation. Risks and benefits were discussed. Informed consent was obtained.   DESCRIPTION OF PROCEDURE: The patient is brought to the vascular suite. The right upper extremity was sterilely prepped and draped and a sterile surgical field was created. The fistula was accessed a few centimeters beyond the anastomosis. Under direct ultrasound guidance, a micropuncture needle and micropuncture wire and sheath were placed and parameters recorded. We upsized to a 6 [place] sheath and gave the patient 3000 units of intravenous heparin. Initial imaging showed an occlusion of the cephalic vein in the upper arm. He had drainage through a basilic vein in the upper arm with narrowing in the median antecubital vein  draining into this. The central venous circulation was occluded. There was a temporary dialysis catheter in the left jugular that terminated just in the tip of the superior vena cava beyond the left innominate vein, but the right innominate was occluded with large collaterals seen. I initially treated the outflow vein stenosis. I crossed into the basilic vein through the median antecubital vein and treated this narrowed vein with a 7 mm diameter angioplasty balloon. 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 This improved the outflow, however, the remaining central venous occlusion was present. I tried multiple wires and catheters to try to cross this chronic total occlusion, but was unsuccessful. When it was clear after many attempts that this was not going to be a successful endeavor from the fistula approach, I elected to terminate the procedure. It could be considered to come from a groin approach. The jugular vein appeared occluded as well, but this approach may also be attempted in the future. At this point, sheath was removed, 4-0 Monocryl pursestring suture was placed, pressure was held, sterile dressing was placed. The patient tolerated the procedure well and was taken to the recovery room in stable condition.    ____________________________ Algernon Huxley, MD jsd:TT D: 10/04/2014 17:06:11 ET T: 10/04/2014 20:18:21 ET JOB#: [id number]  cc: Algernon Huxley, MD, <Dictator> Algernon Huxley MD ELECTRONICALLY SIGNED 10/11/2014 10:14

## 2015-03-19 NOTE — H&P (Signed)
PATIENT NAME:  Christopher [name], BAIL MR#:  [id number] DATE OF BIRTH:  04/25/1946  DATE OF ADMISSION:  09/29/2014  REFERRING PHYSICIAN:  [name], MD  PRIMARY CARE PHYSICIAN:  [name], MD of [facility]   ADMITTING PHYSICIAN:  [name], MD   CHIEF COMPLAINT:  Abdominal pain for the past 1 to 2 days.   HISTORY OF PRESENT ILLNESS:  A 69 year old African-American male, a resident of [facility], who was brought by EMS with the complaint of abdominal pain, which started about 1 day ago, which is kind of generalized and associated with some constipation and a low-grade fever. Denies any nausea or vomiting. No urinary symptoms like frequency or urgency. In fact, the patient has end-stage renal failure on hemodialysis 3 times a week and does not have any urine output. The patient also complains of some cough for which is chronic, but lately he has seen increased production of sputum with light-yellow color for the past few days. The patient was noted to be hypoxic on room air with O2 saturation of 85%, for which he received oxygen through nasal cannula, and currently his room air O2 saturations are 97%. He is not complaining of any chest pain. No shortness of breath. No dizziness. No loss of consciousness.   In the Emergency Room, the patient was evaluated by the Emergency Room physician, and workup with CT scan of the abdomen revealed diverticulitis with small perforation and without any abscess. The patient was started on IV antibiotics, Cipro and Flagyl, by the ED physician. General surgery, Dr. [name], was consulted, which is pending at this time. Hospitalist service was further consulted for further evaluation and management. The patient received some pain medications in the Emergency Room. Currently, pain is under control, and the patient states he is feeling slightly better. He is a resident of [facility] and is basically wheelchair bound for the past few years.   PAST MEDICAL  HISTORY: 1.  Hypertension.  2.  End-stage renal disease on hemodialysis Monday, Wednesday, and Friday. Last hemodialysis was on Monday, 09/27/2014.  3.  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  4.  Hyperlipidemia.  5.  Gastroesophageal reflux disease.  6.  Peripheral neuropathy.  7.  History of nephritis.  8.  History of prostate cancer status post radiation treatment in 2009.   PAST SURGICAL HISTORY:  Status post right total knee replacement.   ALLERGIES:  No known drug allergies.   HOME MEDICATIONS: 1.  Acetaminophen/hydrocodone 325/7.5 mg 1 to 2 tablets p.o. q. 4 hours p.r.n.  2.  Amlodipine 2.5 mg tablet 1 tablet orally once a day.  3.  Atorvastatin 20 mg tablet 1 tablet orally once a day.  4.  Enalapril 20 mg tablet 1 tablet orally 2 times a day.  5.  Gabapentin 300 mg capsule 1 capsule orally once a day.  6.  Metoprolol 25 mg tablet, 2 tablets orally twice a day.  7.  Nephrocaps, vitamin B complex and C with folic acid 1 capsule orally once a day.  8.  Nexium 20 mg orally, 1 capsule once a day.  9.  PhosLo 667 mg oral capsule 2 capsules orally 3 times a day with meals.  10.  Prednisone 10 mg tablet 3 tablets orally in the morning.  11.  Prednisone 20 mg tablet 1 tablet orally in the evening.  12.  Sensipar 30 mg oral tablet 1 tablet orally once a day.  13.  Tizanidine 4 mg tablet 1 tablet orally every 6 hours  as needed for muscle spasms.   FAMILY HISTORY:  Mom died of severe myocardial infarction.    SOCIAL HISTORY:  [facility] He is divorced. He is basically wheelchair bound for the past few years following his right total knee replacement. History of smoking in the past, quit in 1985. History of heavy alcohol usage in the past, quit about 20 years ago. Denies any substance abuse.   REVIEW OF SYSTEMS: CONSTITUTIONAL:  Low-grade fever positive. He does have some fatigue and generalized weakness.   EYES:  Negative for blurred vision  or double vision. No pain. No redness. No inflammation.  EARS, NOSE, AND THROAT:  Negative for tinnitus, ear pain, hearing loss, epistaxis, nasal discharge, or difficulty swallowing.  RESPIRATORY:  Does have some chronic cough, but increased in sputum production and yellow discoloration for the past few days. No wheezing. No shortness of breath. No painful respirations.  CARDIOVASCULAR:  Negative for chest pain. No orthopnea. No pedal edema. No dyspnea. No palpitations. No syncope.  GASTROINTESTINAL:  Positive for abdominal pain, which is generalized of 1 day duration. Negative for nausea, vomiting, or diarrhea. He does have some constipation. No rectal bleeding. No melena.   GENITOURINARY:  The patient has end-stage renal disease on hemodialysis 3 times a week, does not have any urine output.  ENDOCRINE:  Negative for polyuria or polydipsia. No heat or cold intolerance.  HEMATOLOGIC/LYMPHATIC:  Negative for easy bruising or bleeding.  MUSCULOSKELETAL:  History of polymyositis on oral prednisone and p.r.n. tizanidine, stable symptoms.  NEUROLOGICAL:  Negative for focal weakness or numbness. Negative for history of CVA, TIA, or seizure disorder. Positive for peripheral neuropathy for which he takes gabapentin.  PSYCHIATRIC:  Negative for anxiety, depression, or insomnia.   PHYSICAL EXAMINATION: VITAL SIGNS:  Temperature 99.4 degrees Fahrenheit, pulse rate 96 per minute, respirations 20 per minute, blood pressure 154/81, and O2 saturation on arrival 84% on room air and currently 99% on room air.   GENERAL:  Well nourished, well developed, pleasant and cooperative, alert, in no acute distress, comfortably lying in the bed.  HEAD:  Atraumatic, normocephalic.  EYES:  Pupils are equal and reactive to light. No conjunctival pallor. No scleral icterus. Extraocular movements are intact.  NOSE:  No nasal lesions. No drainage.  EARS:  No drainage. No external lesions.  ORAL CAVITY:  No mucosal lesions. No  exudates. No masses.  NECK:  Supple. No JVD. No thyromegaly. No carotid bruit. Range of motion of neck is normal.  RESPIRATORY:  Bilateral air entry present. A few rales at the bases on both sides present. No rhonchi.  CARDIOVASCULAR:  S1, S2 regular. No murmurs, gallops, or clicks appreciated. Peripheral pulses at carotid, femoral, and pedal pulses are equal and symmetrical. No peripheral edema.  GASTROINTESTINAL:  Diffuse mild tenderness present. No guarding. Gaseous distention present. No hepatosplenomegaly. Bowel sounds present.  GENITOURINARY:  Deferred.  MUSCULOSKELETAL:  Decreased range of motion in lower extremities, otherwise negative for any acute joint tenderness.  SKIN:  Within normal limits. Onychomycosis of toenails present.  LYMPHATIC:  No cervical lymphadenopathy.  VASCULAR:  Good dorsalis pedis and posterior tibial pulses.  NEUROLOGICAL:  Alert, awake, and oriented x 3. Cranial nerves II through XII are grossly intact. No focal sensory or motor deficit. Motor strength is 4 to 5 out of 5 in both upper and lower extremities.  PSYCHIATRIC:  Judgment and insight are adequate. Alert and oriented x 3. Memory and mood are within  normal limits.   LABORATORY DATA:  Serum glucose 90, BUN 76, creatinine 5.93, sodium 138, potassium 5.9, serum chloride 97, bicarbonate 27, calcium 8.0, lipase 429, total protein 6.8, albumin 2.4, total bilirubin 0.4, alkaline phosphatase 87, AST 43, ALT 35. CPK-MB 4.7. Troponin 0.33. WBC 6.1, hemoglobin 11.7, hematocrit 36.3, platelet count 144,000.   IMAGING STUDIES:  Chest x-ray:  Low volume chest. Symmetrical bilateral basilar opacity present, favored to represent atelectasis. No definite airspace consolidation. No effusions.   Abdominal plain x-ray:  Gas and stool scattered throughout the colon. No small or large bowel distention. No free intra-abdominal air. No abnormal air-fluid levels. No radiopaque stone. Impression:  Nonobstructive bowel gas pattern.    CT of the abdomen and pelvis impression:  Inflammatory changes in the low-to-mid descending colon with pericolonic infiltration and small pericolonic gas collection suggesting locally perforated diverticulitis. No abscess.    EKG:  Normal sinus rhythm with ventricular rate of 97 beats per minute. Nonspecific T-wave abnormality, otherwise no acute ST-T changes.    ASSESSMENT AND PLAN:  A 69 year old African-American male with a past medical history significant for hypertension, end-stage renal disease on hemodialysis Monday, Wednesday, and Friday, hyperlipidemia, polymyositis on oral prednisone, gastroesophageal reflux disease, peripheral neuropathy, history of prostate cancer status post radiation treatment in 2009, and history of lupus, who presents with abdominal pain of 24 hours' duration. Workup in the Emergency Room with a CT of the abdomen revealed diverticulitis with minor perforation without any local abscess.   1.  Diverticulitis with minor perforation (no abscess on CT of the abdomen). Plan:  N.p.o. Blood cultures. Continue IV antibiotics. Will change Cipro to Levaquin for better coverage of possible pneumonia and continue Flagyl. Gentle IV hydration, pain control measures, and follow up with general surgery consultation, which was requested by the ED physician and pending at this time.  2.  Hypoxia on room air on arrival to the Emergency Room. Chronic productive cough with increase in the quantity and yellow discoloration lately, which is recent. CT with a possible consolidation versus atelectasis. Will change Cipro to Levaquin for coverage of possible bronchitis versus pneumonia. Oxygen supplementation as needed.  4.  End-stage renal disease on hemodialysis Monday, Wednesday, and Friday. Last hemodialysis was on Monday 09/27/2014. The patient is due for hemodialysis on 09/29/2014. Request nephrology consultation for hemodialysis in the morning.  5.  Hyperkalemia, mild. No EKG changes.  Monitor. Will request for hemodialysis tomorrow morning. Follow up BMP.  6.  Mildly elevated troponin. No chest pain. No acute EKG changes. Likely secondary due to end-stage renal disease. Plan:  Telemetry monitoring, continue beta blocker, cycle cardiac enzymes. Consider further workup accordingly.  7.  Hypertension. The patient is on home medications. Blood pressure is not optimally controlled. Plan:  Continue home medications and use p.r.n. hydralazine for control of hypertension.  8.  Polymyositis on oral prednisone, stable. Continue same.  9.  Hyperlipidemia on Lipitor. Continue same.  10.  History of peripheral neuropathy on gabapentin. Continue same.  11.  Gastroesophageal reflux disease. Continue proton pump inhibitor.  12.  History of lupus, not active. No current treatment. Monitor.  13.  History of prostate cancer status post radiation therapy in 2009. No symptoms. No acute management at this time.  14.  Deep vein thrombosis prophylaxis with subcutaneous heparin.  15.  Gastrointestinal prophylaxis with Protonix.   CODE STATUS:  Full code.   TIME SPENT:  55 minutes.     ____________________________ [name], MD enr:nb D: 09/29/2014 01:29:01 ET  T: 09/29/2014 01:57:06 ET JOB#: [id number]  cc: [name], MD, <Dictator> [name], MD [name] MD ELECTRONICALLY SIGNED 10/22/2014 19:26

## 2015-03-19 NOTE — H&P (Signed)
   Subjective/Chief Complaint LLQ pain, nausea/vomiting   History of Present Illness Christopher [name] is a pleasant 69 yo M with a history of prostate ca s/p radiation and renal failure for which he is dialyzed 3x/weekly who presents with 3 days of abdominal pain.  Diffuse but more tender LLQ.  + N/V.  1 episode of similar 1 year ago, thought to be "infection".  No fevers/chills   Past History Prostate Ca post radiation H/o Lupus HTN Renal failure on dialysis Fistula right upper extremity   Past Medical Health Hypertension, Cancer   Code Status Full Code   Past Med/Surgical Hx:  Cancer, Prostate:   Hypertension:   Hypotension:   Lupus:   Dialysis: Tuesday, Thursday, and Saturday  fistula: in left upper arm  Renal Failure:   ALLERGIES:  No Known Allergies:   Family and Social History:  Family History Coronary Artery Disease   Social History negative tobacco, negative ETOH, negative Illicit drugs   Place of Living Nursing Home  [facility]   Review of Systems:  Subjective/Chief Complaint Abdominal pain, N/V   Fever/Chills No   Cough No   Sputum No   Abdominal Pain Yes   Diarrhea No   Constipation No   Nausea/Vomiting Yes   SOB/DOE No   Chest Pain No   Dysuria No   Tolerating PT No   Tolerating Diet No   Physical Exam:  GEN well developed, no acute distress, obese   HEENT pink conjunctivae, PERRL, hearing intact to voice, moist oral mucosa   RESP normal resp effort  clear BS  no use of accessory muscles   CARD regular rate  no murmur  no thrills   ABD positive tenderness  no hernia  soft  normal BS  LLQ tenderness   EXTR negative cyanosis/clubbing, negative edema   SKIN normal to palpation, No rashes, No ulcers   NEURO cranial nerves intact, negative rigidity, positive tremor, strength:, motor/sensory function intact   PSYCH alert, A+O to time, place, person, good insight    Assessment/Admission Diagnosis 69 yo M admit with abd pain.  WBC  nl.  CT shows stranding around descending with small locules of air in omental fat.  Probable perforated diverticulitis.  + troponin, possible demand ischemia.   Plan Admit, NPO, IV antibiotics.  Renal consult.  Will follow.  No need for emergent surgical intervention at this time.   Electronic Signatures: Floyde Parkins (MD)  (Signed [phone] 01:00)  Authored: CHIEF COMPLAINT and HISTORY, PAST MEDICAL/SURGIAL HISTORY, ALLERGIES, FAMILY AND SOCIAL HISTORY, REVIEW OF SYSTEMS, PHYSICAL EXAM, ASSESSMENT AND PLAN   Last Updated: 04-Nov-15 01:00 by Floyde Parkins (MD)

## 2015-03-19 NOTE — Discharge Summary (Signed)
PATIENT NAME:  Christopher [name], Christopher [name] MR#:  [id number] DATE OF BIRTH:  1946-04-23  DATE OF ADMISSION:  09/29/2014 DATE OF DISCHARGE:    DISPOSITION:  [facility].   ADMITTING DIAGNOSIS:  1.  Acute diverticulitis with perforation.  2.  Respiratory failure with hypoxia due to bilateral pulmonary infiltrates suspecting pneumonia, now on high flow nasal cannula status post urgent continuous renal therapy on 10/08/2014, now on hemodialysis starting 10/10/2014.  3.  Candida albicans sepsis due to diverticulitis and perforation.  4.  Intraperitoneal abscess formation status post percutaneous drainage 10/08/2014.  5.  Septic shock due to this infection.  6.  Acute diverticulitis with perforation and abscess formation in left mid abdomen, status post CT guided drain placement 10/08/2014, wound cultures Candida albicans.  7.  Other wound cultures from abdominal incision site in midabdomen also growing moderate growth of yeast; sputum cultures colonies too small to read, moderate growth of yeast.  8.  History of end-stage liver disease, status post left internal jugular temporary catheter placement for continuous renal therapy, initiated on November 13, discontinue 10/10/2014, now back on hemodialysis per nephrology.  9.  Candida albicans pneumonia.  10.  Right lower extremity deep vein thrombosis status post inferior venae cavae filter placement 10/06/2014, by Dr. Delana Meyer, vascular surgeon.  11.  Elevated troponin due to demand ischemia, no acute coronary syndrome, no intervention recommended by cardiology.  12.  History of hypertension.  13.  Anemia of chronic disease, status post 1 unit of packed red blood cell transfusion and continued Epogen injections during hemodialysis, negative occult blood.  14.  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  15.   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  16.  History of hyperlipidemia.  17.  Gastroesophageal reflux disease.  18.  Peripheral neuropathy.  19.  History of nephritis.  20.  Prostate cancer status post radiation therapy in 2009.  21.  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   DISCHARGE CONDITION: Stable.   MEDICATIONS:  According to medical records the patient is on:  1.  Dextrose 30 mL an hour.  2.  Tylenol suppository 650 mg rectally every 4 hours as needed.  3.  Norco 7.5/325 mg 2 tablets orally every 4 hours as needed.  4.  Line flushes with normal saline and heparin.  5.  Zofran 4 mg IV every 4 hours as needed.  6.  Protonix suspension 40 mg per Dobbhoff daily.  7.  Docusate 100 mg twice daily as needed.  8.  Meropenem 1 gram every 12 hours.  9.  Morphine 2.24 mg IV every 4 hours as needed.  10.  Procrit 20,000 units subcutaneously weekly.  11.  Metoprolol injections 2.5 mg IV every 6 hours.  12.  Micafungin 100 mg IV every 24 hours started on 10/09/2014.   HOSPITAL COURSE: The patient is a 69 year old male with past medical history significant for history of multiple medical problems including end-stage renal disease, who presents to the hospital with complaints of abdominal pain. Please refer to admission note dictated by Dr. Reece Levy on 09/29/2014. He was noted to have hypoxia, O2 saturation of 85% on room air. He underwent CT scan of abdomen and pelvis which revealed inflammatory changes in the lower descending colon and intracolonic infiltration and small pericolonic gas collection suggesting locally perforated diverticulitis. The patient was admitted to the  hospital for further evaluation and consultation with surgery was obtained. The patient was started on antibiotic therapy. Patient  deteriorated on 10/05/2014, in the evening and had increasing work of breathing, placed on BiPAP and transferred to the critical care unit. He was lethargic and had  fevers to 101. Please refer to interim discharge summary dictated on 10/05/2014, by Dr. Bridgett Larsson. The chest x-ray done on 10/05/2014, due to respiratory failure which showed mildly worsening bilateral mid lung and bibasilar opacification which could not exclude infection and possible small amount of bilateral pleural fluid; also left IJ central venous catheter was noted with tip obliquely oriented over the region of  SVC; patient had urgent hemodialysis done on the 10/06/2014, with approximately 1100 mL of fluid removal. He was also noted to have lower extremity swelling on ultrasound of lower extremities. He was found to have right lower extremity nonocclusive DVT, extending from right common femoral through the distal aspect of the right superficial femoral vein, and it was occlusive DVT, seen throughout the right popliteal vein was noted. Also occlusive superficial thrombophlebitis involving the right greater saphenous vein at the level of mid-thigh and knee  were noted. Since patient was hemodynamically unstable and felt to be not a candidate for anticoagulation, IVC filter was placed by Dr. Leotis Pain, the same day, 10/06/2014.  He continued to have abdominal pain, so CT scan of abdomen and pelvis was performed without contrast 10/06/2014, which showed diverticulosis of the colon with persistent evidence of acute diverticulitis involving the descending colon, persistent foci of extraluminal air within the left abdomen slightly worse with worsening patchy collections of free fluid of slightly high density likely mixing with contrast and indicating persistent perforation were noted. Focal fluid collection in lateral descending colon area was noted to be as big as 3.4 cm over 8.3 cm, also worsening patchy bilateral airspace disease over the middle to lower lungs was noted, concerning for infection.   Because of that patient underwent CT guided L lateral abdominal abscess drainage on 10/08/2014, by interventional  radiology. Successful CT-guided placement of 10 French all-purpose drain catheter was done in the left lateral abdomen with aspiration of approximately 20 mL of purulent fluid. Samples were sent to laboratory and they showed growth of  Candida albicans. Patient had blood cultures repeated on the 10/07/2014; because of high fevers and 1 out of 4  blood cultures were also growing Candida albicans. As soon as cultures came back positive for Candida albicans, patient was initiated in micafungin. An additional well defined fluid collection was noted in the midline to left hemi abdomen, was also noted on CT scan and patient had an incision done in that area and fluid cultures as well which are still growing and not identified, moderate growth of yeast was noted. Sputum cultures taken on 10/10/2014, also revealed many white blood cells, as well as moderate yeast. Patient was septic and required pressors initially; however, he improved significantly with antibiotic and antifungal therapy and is much more comfortable now.   First in regards to respiratory failure, it was felt to be due to hypoxia, due to pulmonary infiltrates, as well as likely pneumonia and atelectasis. Now patient is being continued on high flow nasal cannula and his oxygen saturation at around 100%. He received CRT started 10/08/2014, finished the 10/10/2014, now he is going to be on hemodialysis since his blood pressure improved to be started 10/11/2014.   Next in regards to candida sepsis, it was felt to be due to diverticulitis with  perforation, intraperitoneal abscess formation. Again this was drained percutaneously and patient is receiving antifungal therapy with micafungin, started on 10/09/2014.   Next, in regards to acute diverticulitis with perforation, the patient has been receiving antibiotic therapy, as well as meropenem and micafungin. Infectious disease specialist is following the patient along and recommended to continue therapy.  Surgery is also following patient, although no surgical intervention is recommended at present, apart from prior mentioned interventional radiologist placement of CT-guided drainage.   In regards to end-stage renal disease, the patient had a left IJ temporary catheter placed during this admission. He is receiving CRT and hemodialysis according to the hemodynamic status.   Next, in regards to right lower extremity DVT, as mentioned above the patient received IVC filter placement due to high risk of bleeding and ongoing abdominal problems requiring intervention.    Next, for elevated troponin, it was felt to be due to demand ischemia and no intervention was recommended by cardiology at present. The patient had echocardiogram done on the 16th of November, revealing a normal ejection fraction of 65% to 70%, normal global left ventricular function, mild concentric LVH, moderately increased left ventricular septal thickness, mild aortic valve sclerosis without stenosis, moderately increased left ventricular posterior wall thickness and mild tricuspid regurgitation. The patient is receiving beta blockers and he is able to tolerate beta blockers with no significant dip in his blood pressure readings.   Regarding nutrition, patient has known history of dysphagia since 1998. Apparently patient had some spinal bone spurs infringing upon upper esophagus restricting food bolus migration downwards. Concerning for risks of aspiration, the patient speech remains slurred and for this reason he is not taking any p.o. feeds at present.  Dobbhoff is placed, but very likely depending on the patient's mental status and inability to take oral nutrition, he will eventually need a PEG tube placement as well.   His family requested a [facility] transfer. I contacted [facility] today, I discussed the patient's case with intensive care physician,  DR Gretchen Portela, who graciously accepted the patient for transfer.   The patient is being discharged  in relatively stable condition with the above-mentioned medications and follow-up.   On the day of discharge, temperature is 98.8, pulse was 112, ranging from 100 to 116; respiration rate was 18 to 27, blood pressure 140/77, saturation 100% on high flow nasal cannula.   TIME SPENT:  Was 50 minutes with the patient.    ____________________________ Theodoro Grist, MD rv:nt D: 10/11/2014 16:56:29 ET T: 10/11/2014 17:26:35 ET JOB#: [id number]  cc: Theodoro Grist, MD, <Dictator> Dan Dissinger MD ELECTRONICALLY SIGNED 10/25/2014 20:59

## 2015-03-19 NOTE — Consult Note (Signed)
PATIENT NAME:  Christopher [name], Christopher [name] MR#:  [id number] DATE OF BIRTH:  23-Oct-1946  DATE OF CONSULTATION:  10/07/2014  REQUESTING PHYSICIAN:  Dr. Posey Pronto.   CONSULTING PHYSICIAN:  Cheral Marker. Ola Spurr, MD  REASON FOR CONSULTATION: Sepsis and intra-abdominal abscess.   HISTORY OF PRESENT ILLNESS: This is a 69 year old gentleman admitted 11/04 with abdominal pain.  He has a history of end-stage renal disease and polymyositis on chronic oral prednisone.  He was found to have diverticulitis with a small perforation. He was started on IV antibiotics, including Cipro and Flagyl. He also had hypoxia and possible pneumonia.  He then switched to [place].  He had progressive decline in sepsis requiring pressors. He also had issues with his right hemodialysis AV fistula and ended up having placement of a left IJ double-lumen dialysis catheter on 11/06.  On 11/09, he underwent right upper extremity fistulogram and angioplasty.  He then had progressive edema and developed respiratory failure 11/11 requiring BiPAP.  He is currently on pressors. He has also had fevers and a CT scan reveals progressive abscess at the site of the diverticula.  We are consulted for further antibiotic management.   PAST MEDICAL HISTORY:  1.  End-stage renal disease.  2.  Hypertension.  3.  Prostate cancer status post radiation.  4.  Peripheral neuropathy.  5.  GERD.  6.  Hyperlipidemia. 7.  Polymyositis on chronic oral prednisone.   SOCIAL HISTORY: [facility] He is divorced.  He is wheelchair-bound for the last several years.  He does have a history of right total knee replacement. He quit smoking in 1985.  History of prior heavy alcohol use, but none in many years.   FAMILY HISTORY: Noncontributory.   ALLERGIES: No known allergies.   ANTIBIOTICS SINCE ADMISSION: Include currently ertapenem.  He was also on levofloxacin 11/03 for 1 dose and metronidazole 11/03.  He also received  vancomycin on 11/10.   REVIEW OF SYSTEMS:  Unable to be obtained.   PHYSICAL EXAMINATION:  VITAL SIGNS: Temperature 103.3, pulse 125, blood pressure 95/58, respirations 18, saturation 92% on high flow.  GENERAL:  He is critically ill-appearing. He is obese, quite disheveled and chronically ill-appearing. He has high flow nasal cannula. He is minimally responsive.  HEENT: Pupils are reactive.  Oropharynx is dry.  NECK: Supple. He has a left IJ hemodialysis catheter.  HEART: Tachy but regular.  LUNGS: Coarse breath sounds bilaterally.  ABDOMEN: Soft, mildly distended.  EXTREMITIES: He has 2+ edema in both upper and lower extremities.  He has a right AV fistula site that is somewhat warm.  His feet are quite cold.  He has what appears to be a purplish toe on his right fifth toe.   DATA: Imaging: CAT scan done 11/11 compared with 11/03 shows lung base with posterior bibasilar consolidation and patchy airspace opacification, likely infection. There is also continued evidence of mild cholelithiasis, a small amount of contrast within the gallbladder, likely from the patient's recent IVC filter placement.  There is diverticulosis of the colon with persistent evidence of acute diverticulitis involving the descending colon. There is a persistent foci of extraluminal air within the left abdomen, slightly worse with worsening patchy collections or free fluid of slightly higher density, likely mixed with contrast and indicating persistent perforation.  This measures 3.4 x 8.3 cm.  Ultrasound, patient was noted to have DVT nonocclusive in the right common femoral vein.  Chest x-ray 11/10 showed mild worsening bilateral mid lung  and bibasilar opacifications.  White count on 11/10 is 4.9, hemoglobin 10.8, platelets 192,000.  Renal function is consistent with end-stage renal disease.  LFTs 11/10 showed an albumin of 1.9, otherwise normal. Troponin is positive at 0.33.  Blood cultures 11/04 are negative x 3.  His sputum  cultures 11/05 grew moderate growth Candida albicans and normal flora otherwise.  Blood cultures 11/10 no growth to date.   IMPRESSION:  A 69 year old gentleman with end-stage renal disease on hemodialysis, admitted with abdominal pain and found to have a descending colon perforated diverticula.  He has since developed progressive hypoxia, possible bibasilar pneumonia, lower extremity deep vein thrombosis and persistent fever.  CT shows progression of the abscess. He also had recent manipulation of his AV fistula site.  He has a hemodialysis catheter in.  He remains off pressors.   I suspect his fevers are from the abscess, although other possibilities would include vascular infection with recent manipulation of his fistula and having a hemodialysis catheter in place.   RECOMMENDATIONS:  1.  Change ertapenem to meropenem. This will provide broader coverage, especially of enterococcus and pseudomonas.  2.  Would add vancomycin to cover gram-positives with the dialysis catheter in place.  3.  He has been seen by surgery and is a very poor surgical candidate. Could consider seeing if Interventional Radiology can place a drain in the abscess, although I think this would only be a temporizing measure.   Thank you for the consult. I will be glad to follow with you.     ____________________________ Cheral Marker. Ola Spurr, MD dpf:DT D: 10/07/2014 11:39:08 ET T: 10/07/2014 12:24:42 ET JOB#: [id number]  cc: Cheral Marker. Ola Spurr, MD, <Dictator> DAVID Ola Spurr MD ELECTRONICALLY SIGNED 10/13/2014 20:47

## 2016-02-06 IMAGING — CT CT ABD-PELV W/O CM
2 of 4 series · 17 of 46 positions shown, 19 images · non-contrast
Comparison: 05/14/2014

CLINICAL DATA: Diarrhea x3. Constipation. Abdominal pain in all
quadrants. Productive cough. Fever. Elevated lipase. Elevated GFR.
Renal failure on dialysis. History prostate cancer.

EXAM:
CT ABDOMEN AND PELVIS WITHOUT CONTRAST
TECHNIQUE: Multidetector CT imaging of the abdomen and pelvis was performed
following the standard protocol without IV contrast.

[Series 2: routine abd pel without · axial · non-contrast · 0.69mm/px · z∈[-1122,-676]mm · 14 of 97 slices shown, 16 images]
[im 4/97  soft-tissue]
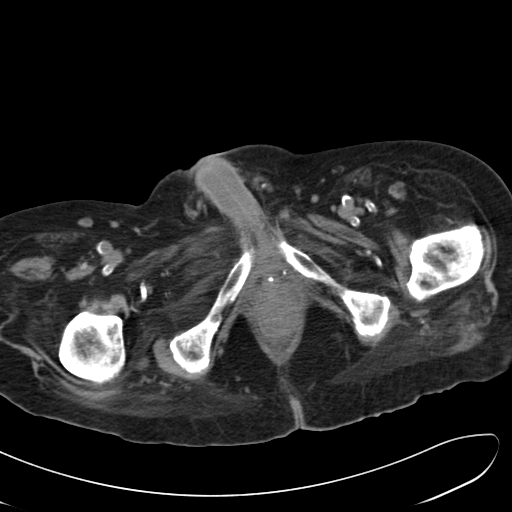
[im 4/97  bone]
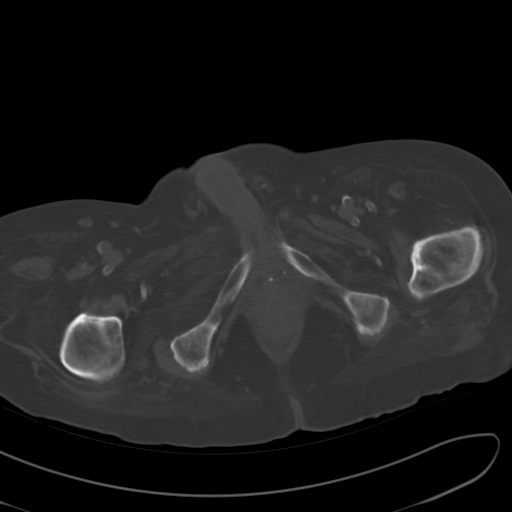
[im 12/97  soft-tissue]
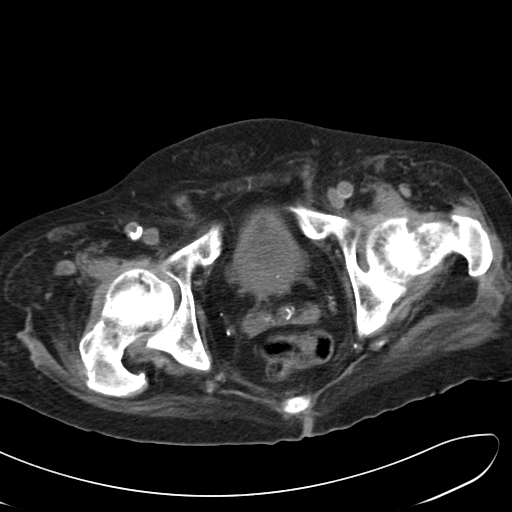
[im 19/97  soft-tissue]
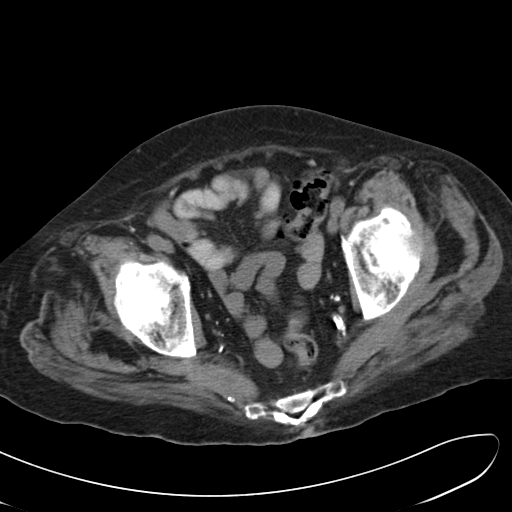
[im 26/97  soft-tissue]
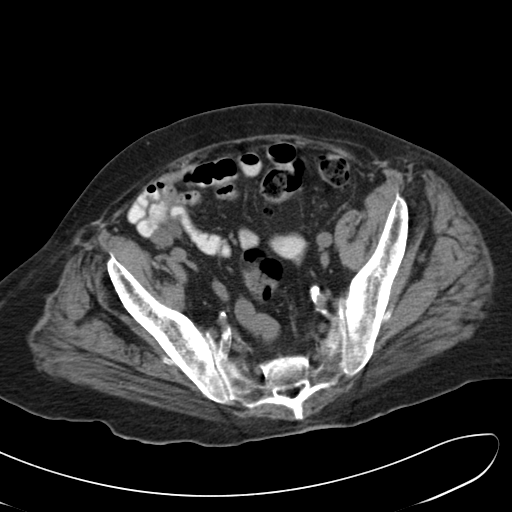
[im 34/97  soft-tissue]
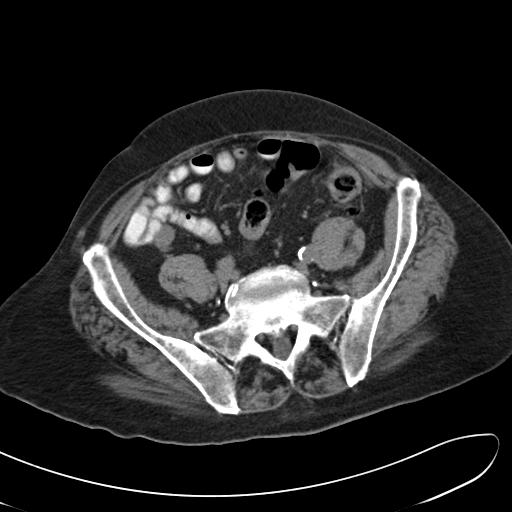
[im 37/97  soft-tissue]
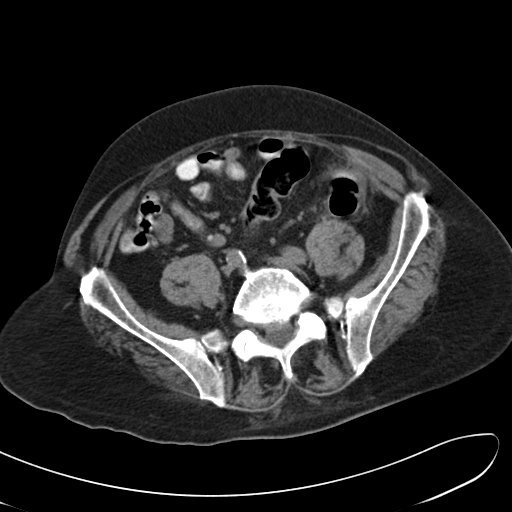
[im 45/97  soft-tissue]
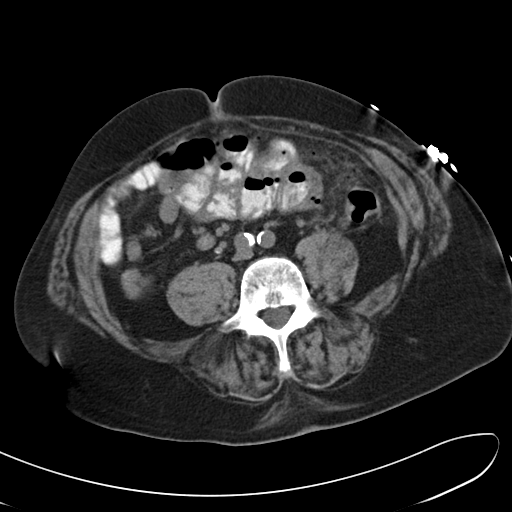
[im 52/97  soft-tissue]
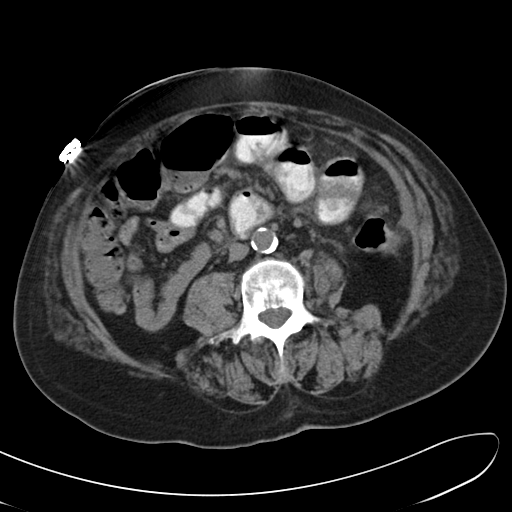
[im 60/97  soft-tissue]
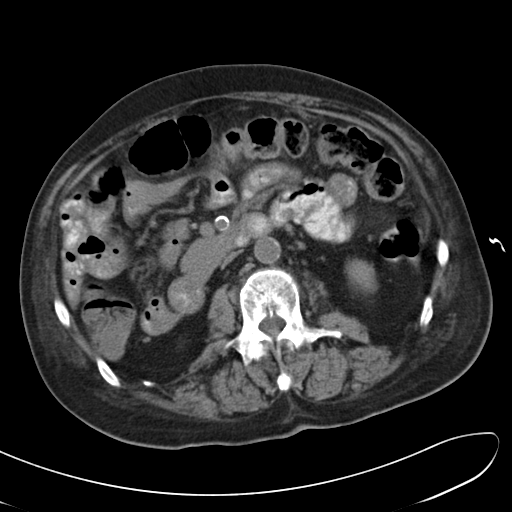
[im 60/97  bone]
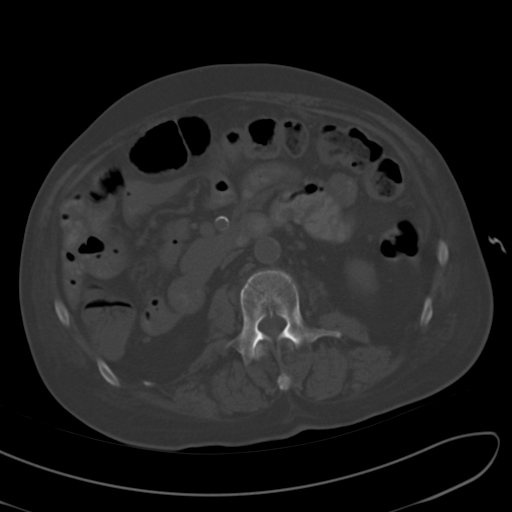
[im 63/97  soft-tissue]
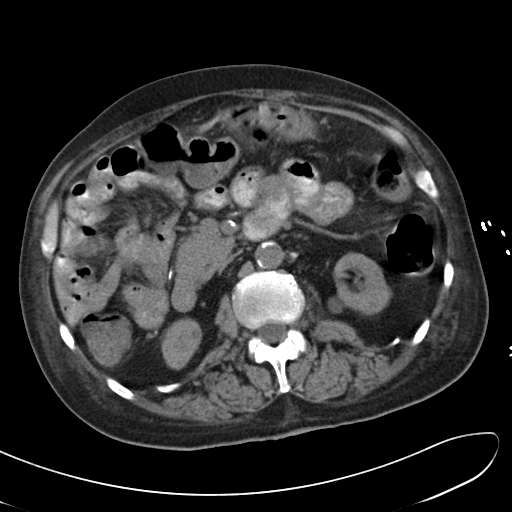
[im 71/97  soft-tissue]
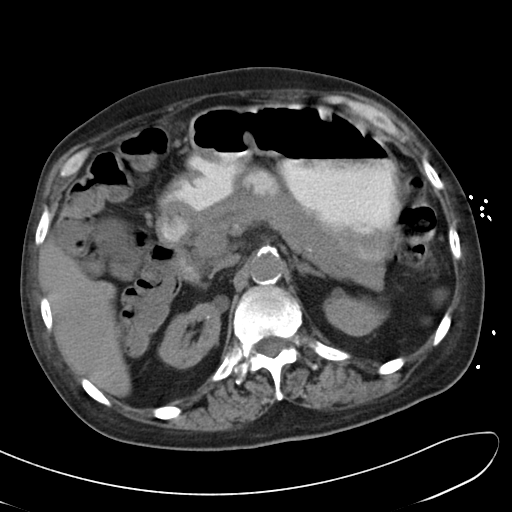
[im 78/97  soft-tissue]
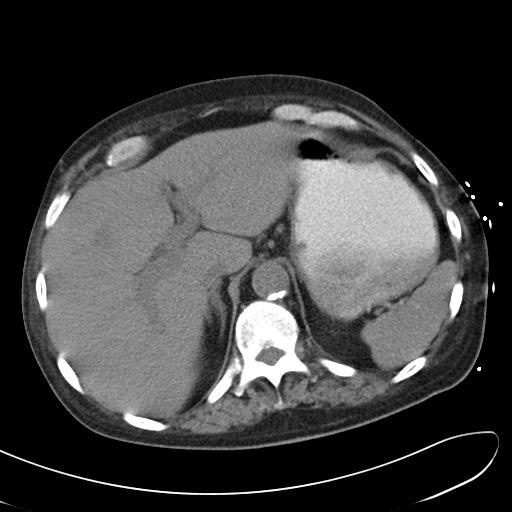
[im 85/97  soft-tissue]
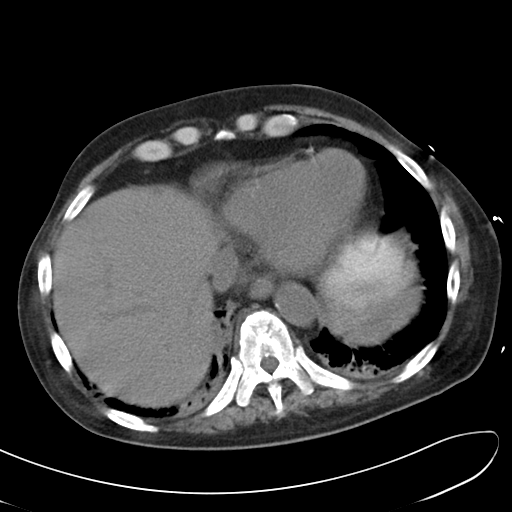
[im 93/97  soft-tissue]
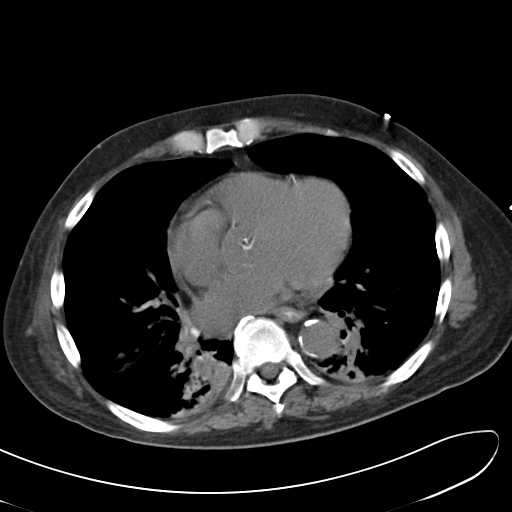

[Series 5: cor routine abd pel wo · coronal · 1.01mm/px · 3 of 133 slices shown]
[im 45/133  soft-tissue]
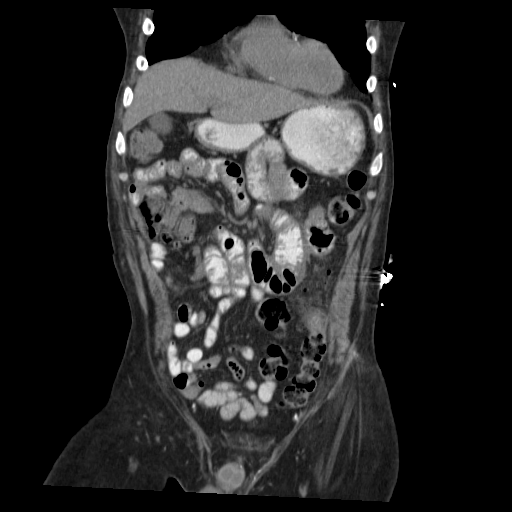
[im 59/133  soft-tissue]
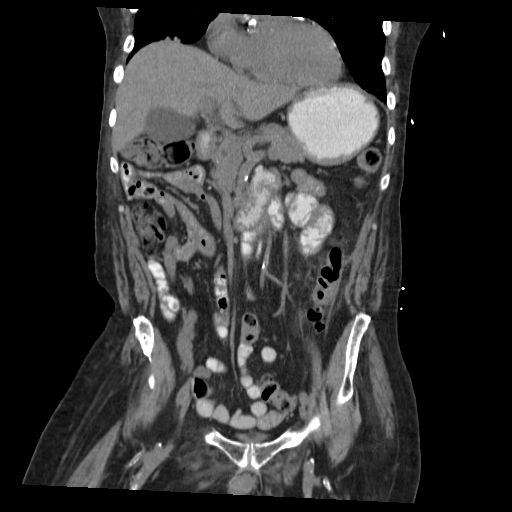
[im 74/133  soft-tissue]
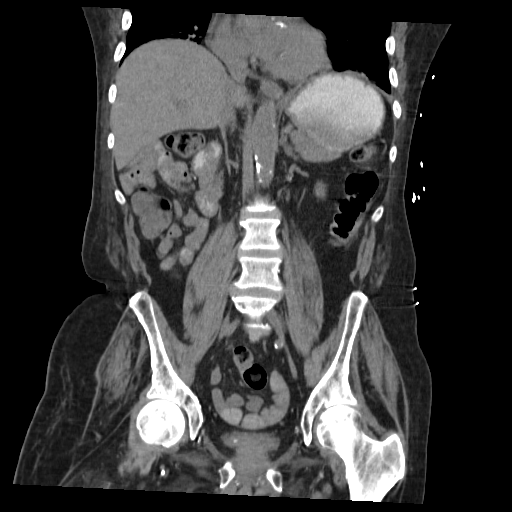

[17 of 46 positions shown; findings below may reference images not displayed]

FINDINGS: There is consolidation in both lung bases. This is increasing since
previous study and could indicate pneumonia. Coronary artery and
aortic calcifications. Cardiac enlargement.

Cholelithiasis with layering stones in the gallbladder. Multiple
bilateral renal cysts without hydronephrosis. The unenhanced
appearance of the liver, pancreas, spleen, adrenal glands, and
retroperitoneal lymph nodes is unremarkable. Calcified aorta without
aneurysm. Inferior vena cava is decompressed suggesting possible
hypovolemia. Stomach and small bowel are unremarkable. Stool-filled
colon without distention. No free air or free fluid in the abdomen.

Pelvis: Diverticulosis of the sigmoid colon. Focal colonic wall
thickening of the mid descending region with pericolonic fatty
infiltration and a small pericolonic free gas collections. This
likely represents locally perforated diverticulitis. Mild wall
thickening in adjacent small bowel loop is likely reactive. No free
or loculated pelvic fluid collections. Prostate gland is enlarged.
Bladder is decompressed. Appendix is normal. Degenerative changes in
the lumbar spine. No destructive bone lesions appreciated.
IMPRESSION: Inflammatory changes in the low to mid descending colon with
pericolonic infiltration and small pericolonic gas collections
suggesting locally perforated diverticulitis. No abscess.
